# Patient Record
Sex: Female | Born: 1995 | Hispanic: Yes | Marital: Married | Smoking: Never smoker
Health system: Southern US, Community
[De-identification: ages and names within clinical notes are randomized; demographics above are authoritative.]

## PROBLEM LIST (undated history)

## (undated) DIAGNOSIS — F329 Major depressive disorder, single episode, unspecified: Secondary | ICD-10-CM

## (undated) DIAGNOSIS — K589 Irritable bowel syndrome without diarrhea: Secondary | ICD-10-CM

## (undated) DIAGNOSIS — R1031 Right lower quadrant pain: Secondary | ICD-10-CM

## (undated) DIAGNOSIS — F319 Bipolar disorder, unspecified: Secondary | ICD-10-CM

## (undated) DIAGNOSIS — F419 Anxiety disorder, unspecified: Secondary | ICD-10-CM

## (undated) DIAGNOSIS — F32A Depression, unspecified: Secondary | ICD-10-CM

## (undated) HISTORY — PX: LASIK: SHX215

## (undated) HISTORY — PX: APPENDECTOMY: SHX54

## (undated) HISTORY — PX: NO PAST SURGERIES: SHX2092

## (undated) HISTORY — DX: Irritable bowel syndrome, unspecified: K58.9

## (undated) HISTORY — PX: COLONOSCOPY: SHX174

---
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
## 2013-03-02 ENCOUNTER — Other Ambulatory Visit: Payer: Self-pay | Admitting: Gastroenterology

## 2013-03-02 DIAGNOSIS — R109 Unspecified abdominal pain: Secondary | ICD-10-CM

## 2013-03-13 ENCOUNTER — Ambulatory Visit
Admission: RE | Admit: 2013-03-13 | Discharge: 2013-03-13 | Disposition: A | Discharge status: Home / Self Care | Payer: BC Managed Care – PPO | Source: Ambulatory Visit | Attending: Gastroenterology | Admitting: Gastroenterology

## 2013-03-13 DIAGNOSIS — R109 Unspecified abdominal pain: Secondary | ICD-10-CM

## 2013-07-13 ENCOUNTER — Emergency Department (HOSPITAL_COMMUNITY)
Admission: EM | Admit: 2013-07-13 | Discharge: 2013-07-14 | Disposition: A | Discharge status: Home / Self Care | Payer: BC Managed Care – PPO | Attending: Emergency Medicine | Admitting: Emergency Medicine

## 2013-07-13 ENCOUNTER — Emergency Department (HOSPITAL_COMMUNITY): Payer: BC Managed Care – PPO

## 2013-07-13 ENCOUNTER — Encounter (HOSPITAL_COMMUNITY): Payer: Self-pay | Admitting: Emergency Medicine

## 2013-07-13 DIAGNOSIS — Z3202 Encounter for pregnancy test, result negative: Secondary | ICD-10-CM | POA: Insufficient documentation

## 2013-07-13 DIAGNOSIS — R1012 Left upper quadrant pain: Secondary | ICD-10-CM | POA: Insufficient documentation

## 2013-07-13 DIAGNOSIS — R161 Splenomegaly, not elsewhere classified: Secondary | ICD-10-CM | POA: Insufficient documentation

## 2013-07-13 DIAGNOSIS — N289 Disorder of kidney and ureter, unspecified: Secondary | ICD-10-CM | POA: Insufficient documentation

## 2013-07-13 DIAGNOSIS — F3130 Bipolar disorder, current episode depressed, mild or moderate severity, unspecified: Secondary | ICD-10-CM | POA: Insufficient documentation

## 2013-07-13 DIAGNOSIS — B2790 Infectious mononucleosis, unspecified without complication: Secondary | ICD-10-CM | POA: Insufficient documentation

## 2013-07-13 DIAGNOSIS — Z79899 Other long term (current) drug therapy: Secondary | ICD-10-CM | POA: Insufficient documentation

## 2013-07-13 DIAGNOSIS — R109 Unspecified abdominal pain: Secondary | ICD-10-CM

## 2013-07-13 LAB — CBC WITH DIFFERENTIAL/PLATELET
Basophils Absolute: 0.2 10*3/uL — ABNORMAL HIGH (ref 0.0–0.1)
Basophils Relative: 2 % — ABNORMAL HIGH (ref 0–1)
Eosinophils Absolute: 0 10*3/uL (ref 0.0–0.7)
Eosinophils Relative: 0 % (ref 0–5)
HCT: 39.1 % (ref 36.0–46.0)
HEMOGLOBIN: 13.4 g/dL (ref 12.0–15.0)
LYMPHS ABS: 5.6 10*3/uL — AB (ref 0.7–4.0)
Lymphocytes Relative: 65 % — ABNORMAL HIGH (ref 12–46)
MCH: 31.1 pg (ref 26.0–34.0)
MCHC: 34.3 g/dL (ref 30.0–36.0)
MCV: 90.7 fL (ref 78.0–100.0)
MONOS PCT: 10 % (ref 3–12)
Monocytes Absolute: 0.9 10*3/uL (ref 0.1–1.0)
NEUTROS ABS: 2 10*3/uL (ref 1.7–7.7)
NEUTROS PCT: 23 % — AB (ref 43–77)
Platelets: 171 10*3/uL (ref 150–400)
RBC: 4.31 MIL/uL (ref 3.87–5.11)
RDW: 13.7 % (ref 11.5–15.5)
WBC: 8.6 10*3/uL (ref 4.0–10.5)

## 2013-07-13 LAB — POCT PREGNANCY, URINE: Preg Test, Ur: NEGATIVE

## 2013-07-13 MED ORDER — OXYCODONE-ACETAMINOPHEN 5-325 MG PO TABS
2.0000 | ORAL_TABLET | Freq: Once | ORAL | Status: AC
  Administered 2013-07-13: 2 via ORAL
  Filled 2013-07-13: qty 2

## 2013-07-13 NOTE — ED Provider Notes (Signed)
CSN: [id number]     Arrival date & time 07/13/13  2137 History  This chart was scribed for non-physician practitioner Antony Madura, PA-C working with Hurman Horn, MD by Donne Anon, ED Scribe. This patient was seen in room TR05C/TR05C and the patient's care was started at 2234.    First MD Initiated Contact with Patient 07/13/13 2234     Chief Complaint  Patient presents with  . Rib Injury    The history is provided by the patient. No language interpreter was used.   HPI Comments: Joanne Herrera is a 18 y.o. female who presents to the Emergency Department complaining of upper left sided abdominal pain pain described as throbbing that began this morning and acutely worsened this afternoon. She was diagnosed with mononucleosis yesterday at an Urgent Care. She reports associated pain with deep breaths. Nothing makes the pain better. She has tried Tylenol with little relief. She denies vomiting, fever, or any other symptoms. She denies any recent trauma or injury to the area.  Past Medical History  Diagnosis Date  . Bipolar depression   . Abdominal pain, acute, right lower quadrant 02/11/2012     Possible Appendix perforation, contained perforation, typhlitis, or Crohn's dz.    . Acute renal insufficiency 04/08/2012   Past Surgical History  Procedure Laterality Date  . No past surgeries    . Colonoscopy    . Laparoscopy  06/02/2012    Procedure: LAPAROSCOPY DIAGNOSTIC;  Surgeon: Cherylynn Ridges, MD;  Location: [facility] OR;  Service: General;  Laterality: N/A;  . Laparotomy  06/02/2012    Procedure: EXPLORATORY LAPAROTOMY;  Surgeon: Cherylynn Ridges, MD;  Location: [facility] OR;  Service: General;  Laterality: N/A;  . Partial colectomy  06/02/2012    Procedure: PARTIAL COLECTOMY;  Surgeon: Cherylynn Ridges, MD;  Location: [facility] OR;  Service: General;  Laterality: Right;   No family history on file. History  Substance Use Topics  . Smoking status: Never Smoker   . Smokeless tobacco: Never Used  . Alcohol Use:  No   OB History   Grav Para Term Preterm Abortions TAB SAB Ect Mult Living                 Review of Systems  Constitutional: Negative for fever.  Gastrointestinal: Positive for abdominal pain. Negative for vomiting.  All other systems reviewed and are negative.    Allergies  Review of patient's allergies indicates no known allergies.  Home Medications   Current Outpatient Rx  Name  Route  Sig  Dispense  Refill  . acetaminophen (TYLENOL) 325 MG tablet   Oral   Take 2 tablets (650 mg total) by mouth every 6 (six) hours as needed (or Temp > 100).         Marland Kitchen divalproex (DEPAKOTE ER) 500 MG 24 hr tablet   Oral   Take 1,000 mg by mouth at bedtime.         Marland Kitchen GILDESS FE 1/20 1-20 MG-MCG tablet   Oral   Take 1 tablet by mouth daily.          Marland Kitchen lamoTRIgine (LAMICTAL) 100 MG tablet   Oral   Take 100 mg by mouth at bedtime.          BP 121/81  Pulse 89  Temp(Src) 98 F (36.7 C) (Oral)  Resp 16  Ht 5\' 3"  (1.6 m)  Wt 139 lb 6 oz (63.22 kg)  BMI 24.70 kg/m2  SpO2 96%  LMP 07/06/2013  Physical Exam  Nursing note and vitals reviewed. Constitutional: She is oriented to person, place, and time. She appears well-developed and well-nourished. No distress.  HENT:  Head: Normocephalic and atraumatic.  Mouth/Throat: Oropharynx is clear and moist. No oropharyngeal exudate.  Mildly erythematous posterior oropharynx with swollen tonsils bilaterally. No tonsillar exudate. Uvula midline without evidence of peritonsillar abscess. Patient tolerating secretions without difficulty or drooling.  Eyes: Conjunctivae and EOM are normal. Pupils are equal, round, and reactive to light. No scleral icterus.  Neck: Normal range of motion.  Cardiovascular: Normal rate, regular rhythm, normal heart sounds and intact distal pulses.   Pulmonary/Chest: Effort normal and breath sounds normal. No respiratory distress. She has no wheezes. She has no rales.  Abdominal: Soft. She exhibits no  distension. There is tenderness (focal in left upper quadrant). There is no rebound and no guarding.  No appreciable splenomegaly. No peritoneal signs or evidence of acute surgical abdomen.  Musculoskeletal: Normal range of motion.  Neurological: She is alert and oriented to person, place, and time.  Skin: Skin is warm and dry. No rash noted. She is not diaphoretic. No erythema. No pallor.  Psychiatric: She has a normal mood and affect. Her behavior is normal.    ED Course  Procedures (including critical care time) DIAGNOSTIC STUDIES: Oxygen Saturation is 97% on RA, adequate by my interpretation.    COORDINATION OF CARE: 11:06 PM Discussed treatment plan which includes Percocet, abdominal ultrasound, urinalysis and labs with pt at bedside and pt agreed to plan. Advised pt to get lots of rest, avoid contact sports, and stay well hydrated.  Labs Review Labs Reviewed  CBC WITH DIFFERENTIAL - Abnormal; Notable for the following:    Neutrophils Relative % 23 (*)    Lymphocytes Relative 65 (*)    Lymphs Abs 5.6 (*)    Basophils Relative 2 (*)    Basophils Absolute 0.2 (*)    All other components within normal limits  URINALYSIS, ROUTINE W REFLEX MICROSCOPIC  COMPREHENSIVE METABOLIC PANEL  POCT PREGNANCY, URINE   Imaging Review Dg Ribs Unilateral W/chest Left  07/13/2013   CLINICAL DATA:  Mono and left-sided pain.  EXAM: LEFT RIBS AND CHEST - 3+ VIEW  COMPARISON:  DG CHEST 2 VIEW dated 04/12/2006  FINDINGS: AP view of the chest and 3 views of left-sided ribs.  Midline trachea. Normal heart size and mediastinal contours. No pleural effusion or pneumothorax. Mildly low lung volumes. Minimal volume loss at the lung bases without airspace consolidation.  Three views of left-sided ribs. No rib fracture. No focal osseous lesion. Surgical clips in the upper abdomen.  IMPRESSION: No acute findings.   Electronically Signed   By: Jeronimo Greaves M.D.   On: 07/13/2013 23:31   US Abdomen  Complete  07/14/2013   CLINICAL DATA:  Left upper quadrant abdominal pain. Known mononucleosis. Assess for splenomegaly.  EXAM: ULTRASOUND ABDOMEN COMPLETE  COMPARISON:  None.  FINDINGS: Gallbladder:  No gallstones or wall thickening visualized. No sonographic Murphy sign noted.  Common bile duct:  Diameter: 0.5 cm, within normal limits in caliber.  Liver:  No focal lesion identified. Within normal limits in parenchymal echogenicity.  IVC:  No abnormality visualized.  Pancreas:  Visualized portion unremarkable.  Spleen:  The spleen remains normal in length, measuring 11.6 cm. However, the volume is greater than the normal range, measuring 402.5 mL. A small splenule is seen at the splenic hilum.  Right Kidney:  Length: 10.8 cm. Echogenicity within normal limits. No mass or hydronephrosis  visualized.  Left Kidney:  Length: 10.5 cm. Echogenicity within normal limits. No mass or hydronephrosis visualized.  Abdominal aorta:  No aneurysm visualized.  Other findings:  None.  IMPRESSION: 1. Mild splenomegaly noted; the spleen has a volume of 402.5 mL. 2. Otherwise unremarkable abdominal ultrasound.   Electronically Signed   By: Roanna RaiderJeffery  Chang M.D.   On: 07/14/2013 00:50    EKG Interpretation   None       MDM   1. Abdominal pain   2. Splenomegaly   3. Mononucleosis    Uncomplicated abdominal pain, likely secondary to mild splenomegaly associated with mononucleosis. Patient with positive Monospot at urgent care 2 days ago. No peritoneal signs appreciated. Patient without leukocytosis or anemia. Urinalysis does not suggest infection and urine pregnancy negative. Patient is well and nontoxic appearing, hemodynamically stable, and afebrile.   CMP is pending at this time. Anticipate discharge with instruction for supportive treatments for mono. Patient has been instructed to refrain from contact sports or high-risk activities which could cause injury to her abdomen until she is cleared by her primary doctor.  Patient signed out to Earley FavorGail Schulz, FNP at end of shift who will follow up on CMP results and dispo appropriately.   I personally performed the services described in this documentation, which was scribed in my presence. The recorded information has been reviewed and is accurate.  Filed Vitals:   07/13/13 2151 07/14/13 0057  BP: 142/79 121/81  Pulse: 100 89  Temp: 98 F (36.7 C)   TempSrc: Oral   Resp: 16 16  Height: 5\' 3"  (1.6 m)   Weight: 139 lb 6 oz (63.22 kg)   SpO2: 97% 96%      Antony MaduraKelly Hamlin Devine, PA-C 07/14/13 0106

## 2013-07-13 NOTE — ED Notes (Signed)
Pt. reports left anterior ribcage pain onset last Friday after lifting heavy trays at work , pt. stated pain worse with movement , certain positions and deep inspiration . Pt. also stated that she was diagnosed with mononucleosis yesterday at an urgent care .

## 2013-07-14 ENCOUNTER — Emergency Department (HOSPITAL_COMMUNITY): Payer: BC Managed Care – PPO

## 2013-07-14 LAB — URINALYSIS, ROUTINE W REFLEX MICROSCOPIC
Bilirubin Urine: NEGATIVE
Glucose, UA: NEGATIVE mg/dL
Hgb urine dipstick: NEGATIVE
KETONES UR: NEGATIVE mg/dL
LEUKOCYTES UA: NEGATIVE
NITRITE: NEGATIVE
PROTEIN: NEGATIVE mg/dL
Specific Gravity, Urine: 1.013 (ref 1.005–1.030)
Urobilinogen, UA: 1 mg/dL (ref 0.0–1.0)
pH: 6.5 (ref 5.0–8.0)

## 2013-07-14 LAB — COMPREHENSIVE METABOLIC PANEL
ALK PHOS: 216 U/L — AB (ref 39–117)
ALT: 267 U/L — ABNORMAL HIGH (ref 0–35)
AST: 218 U/L — ABNORMAL HIGH (ref 0–37)
Albumin: 3.8 g/dL (ref 3.5–5.2)
BILIRUBIN TOTAL: 0.5 mg/dL (ref 0.3–1.2)
BUN: 8 mg/dL (ref 6–23)
CHLORIDE: 105 meq/L (ref 96–112)
CO2: 24 meq/L (ref 19–32)
Calcium: 9.6 mg/dL (ref 8.4–10.5)
Creatinine, Ser: 0.68 mg/dL (ref 0.50–1.10)
GFR calc non Af Amer: >90 mL/min (ref >90)
GLUCOSE: 97 mg/dL (ref 70–99)
POTASSIUM: 4.4 meq/L (ref 3.7–5.3)
Sodium: 142 mEq/L (ref 137–147)
Total Protein: 8.3 g/dL (ref 6.0–8.3)

## 2013-07-14 NOTE — Discharge Instructions (Signed)
Be sure that you get plenty of rest and drink plenty of fluids. Refrain from contact sports or high-risk activities that could cause trauma to your abdomen until cleared by your primary doctor. Return to the emergency department if symptoms worsen.  Infectious Mononucleosis Infectious mononucleosis (mono) is a common germ (viral) infection in children, teenagers, and young adults.  CAUSES  Mono is an infection caused by the Malachi CarlEpstein Barr virus. The virus is spread by close personal contact with someone who has the infection. It can be passed by contact with your saliva through things such as kissing or sharing drinking glasses. Sometimes, the infection can be spread from someone who does not appear sick but still spreads the virus (asymptomatic carrier state).  SYMPTOMS  The most common symptoms of Mono are:  Sore throat.  Headache.  Fatigue.  Muscle aches.  Swollen glands.  Fever.  Poor appetite.  Enlarged liver or spleen. The less common symptoms can include:  Rash.  Feeling sick to your stomach (nauseous).  Abdominal pain. DIAGNOSIS  Mono is diagnosed by a blood test.  TREATMENT  Treatment of mono is usually at home. There is no medicine that cures this virus. Sometimes hospital treatment is needed in severe cases. Steroid medicine sometimes is needed if the swelling in the throat causes breathing or swallowing problems.  HOME CARE INSTRUCTIONS   Drink enough fluids to keep your urine clear or pale yellow.  Eat soft foods. Cool foods like popsicles or ice cream can soothe a sore throat.  Only take over-the-counter or prescription medicines for pain, discomfort, or fever as directed by your caregiver. Children under 20 years of age should not take aspirin.  Gargle salt water. This may help relieve your sore throat. Put 1 teaspoon (tsp) of salt in 1 cup of warm water. Sucking on hard candy may also help.  Rest as needed.  Start regular activities gradually after the  fever is gone. Be sure to rest when tired.  Avoid strenuous exercise or contact sports until your caregiver says it is okay. The liver and spleen could be seriously injured.  Avoid sharing drinking glasses or kissing until your caregiver tells you that you are no longer contagious. SEEK MEDICAL CARE IF:   Your fever is not gone after 7 days.  Your activity level is not back to normal after 2 weeks.  You have yellow coloring to eyes and skin (jaundice). SEEK IMMEDIATE MEDICAL CARE IF:   You have severe pain in the abdomen or shoulder.  You have trouble swallowing or drooling.  You have trouble breathing.  You develop a stiff neck.  You develop a severe headache.  You cannot stop throwing up (vomiting).  You have convulsions.  You are confused.  You have trouble with balance.  You develop signs of body fluid loss (dehydration):  Weakness.  Sunken eyes.  Pale skin.  Dry mouth.  Rapid breathing or pulse. MAKE SURE YOU:   Understand these instructions.  Will watch your condition.  Will get help right away if you are not doing well or get worse. Document Released: 06/08/2000 Document Revised: 09/03/2011 Document Reviewed: 04/06/2008 [facility] Patient Information 2014 [employer].

## 2013-07-16 NOTE — ED Provider Notes (Signed)
Medical screening examination/treatment/procedure(s) were performed by non-physician practitioner and as supervising physician I was immediately available for consultation/collaboration.  Makyia Erxleben M Lolly Glaus, MD 07/16/13 1844 

## 2013-11-28 IMAGING — CT CT ABD-PELV W/ CM
2 of 4 series · 17 of 46 positions shown, 19 images · IV contrast (omnipaque)
Comparison: CT of 02/04/2012

CLINICAL DATA: Follow up of right lower quadrant inflammatory
process.

CT ABDOMEN AND PELVIS WITH CONTRAST
TECHNIQUE: Multidetector CT imaging of the abdomen and pelvis was
performed following the standard protocol during bolus
administration of intravenous contrast.
Contrast: 100mL OMNIPAQUE IOHEXOL 300 MG/ML  SOLN

[Series 2: abd/pelv with 5.0 b31f st · axial · 0.83mm/px · z∈[-493,-83]mm · 14 of 90 slices shown, 16 images]
[im 4/90  soft-tissue]
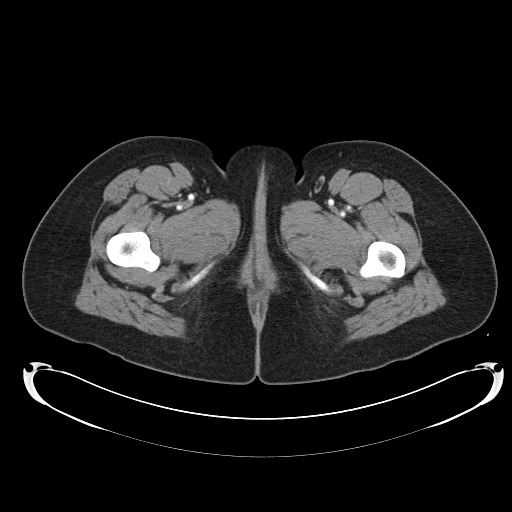
[im 4/90  bone]
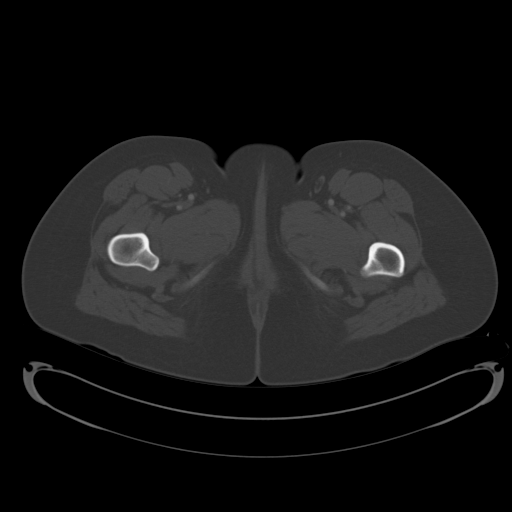
[im 12/90  soft-tissue]
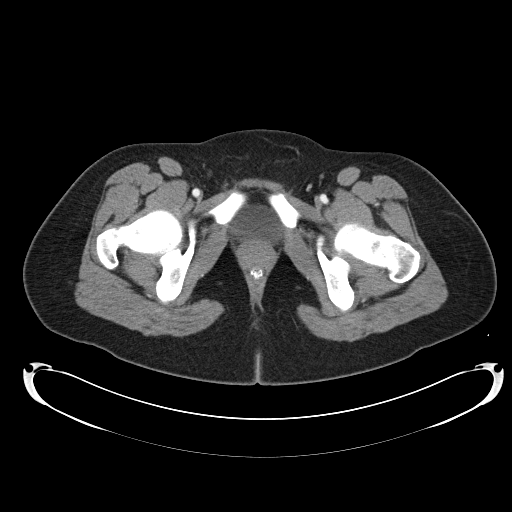
[im 19/90  soft-tissue]
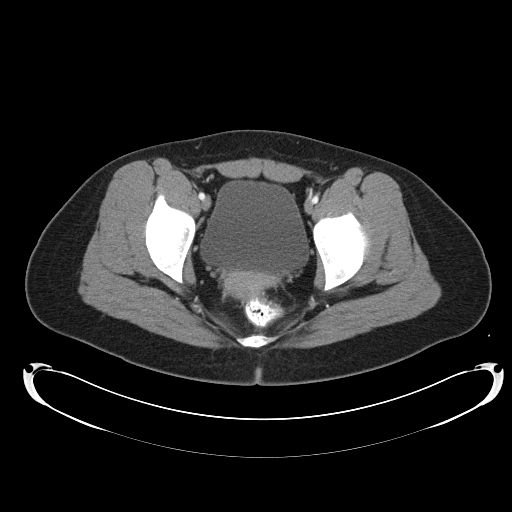
[im 23/90  soft-tissue]
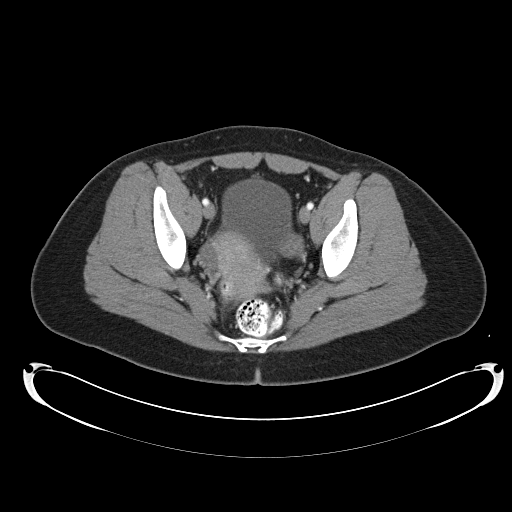
[im 30/90  soft-tissue]
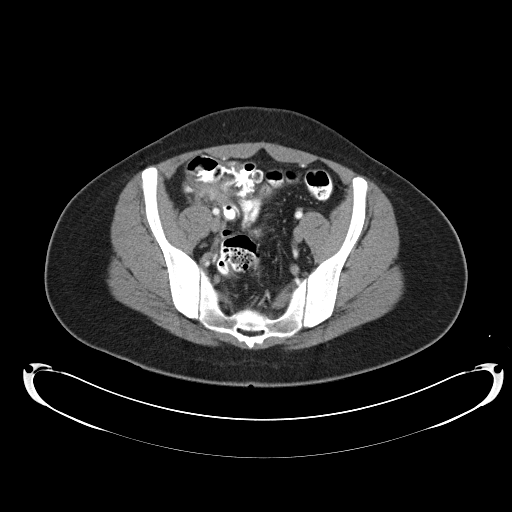
[im 38/90  soft-tissue]
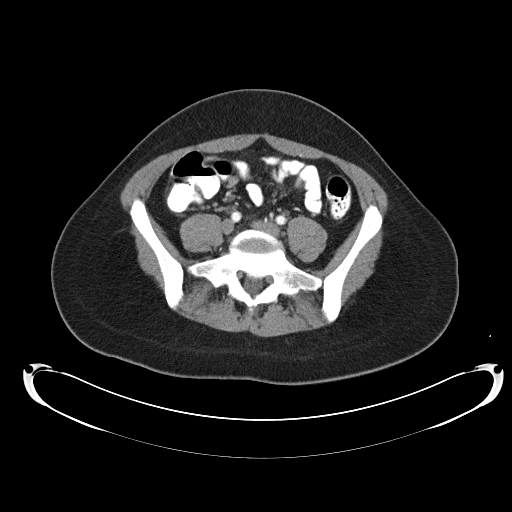
[im 41/90  soft-tissue]
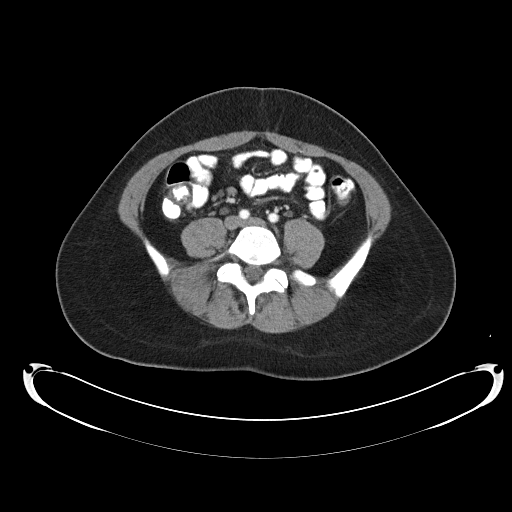
[im 49/90  soft-tissue]
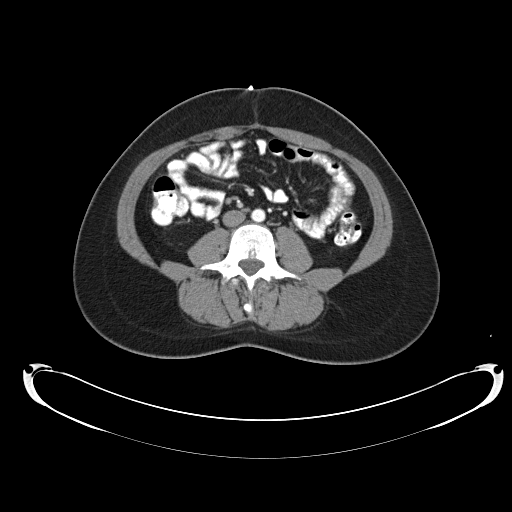
[im 52/90  soft-tissue]
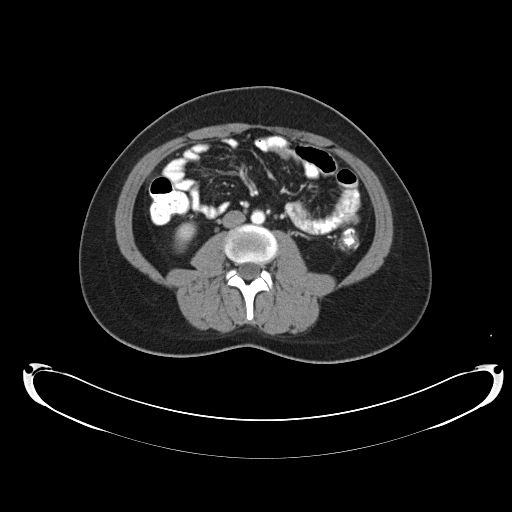
[im 52/90  bone]
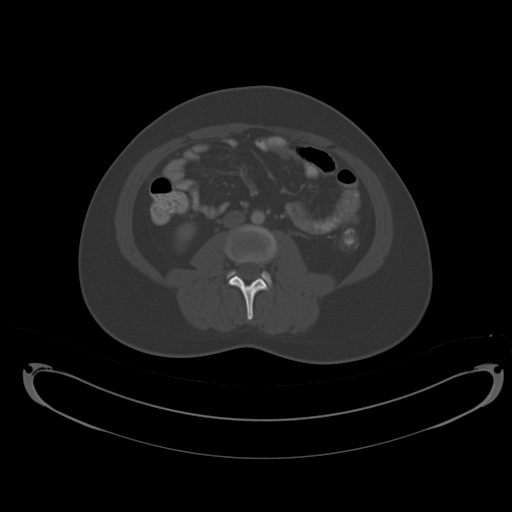
[im 60/90  soft-tissue]
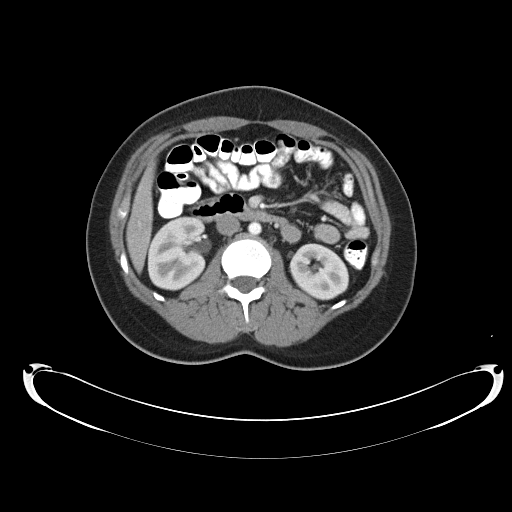
[im 67/90  soft-tissue]
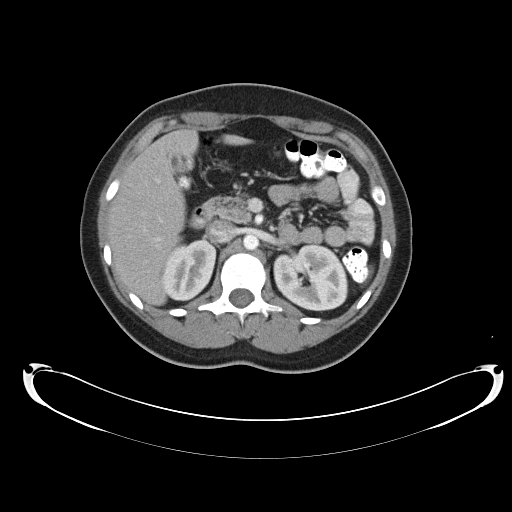
[im 71/90  soft-tissue]
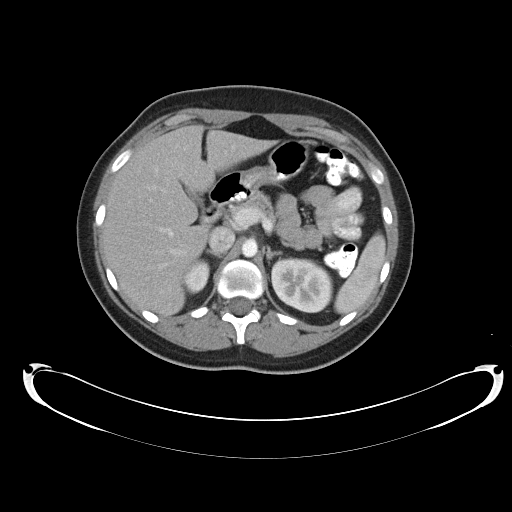
[im 78/90  soft-tissue]
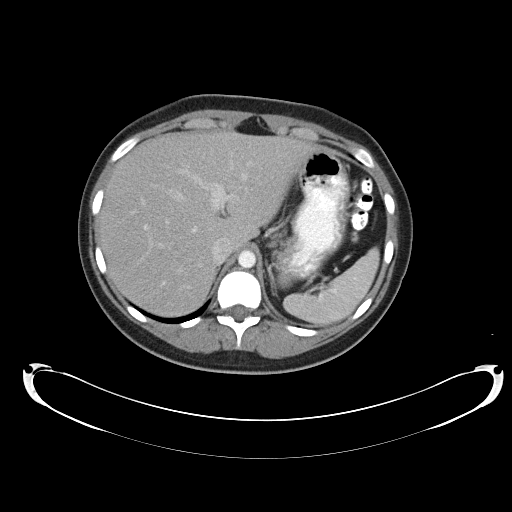
[im 86/90  soft-tissue]
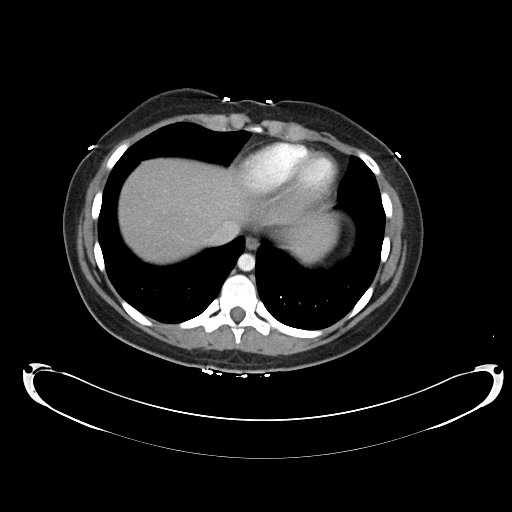

[Series 5: coronals · coronal · 0.94mm/px · 3 of 68 slices shown]
[im 23/68  soft-tissue]
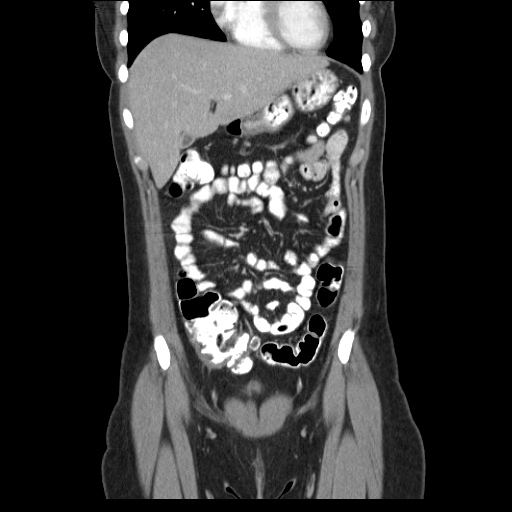
[im 30/68  soft-tissue]
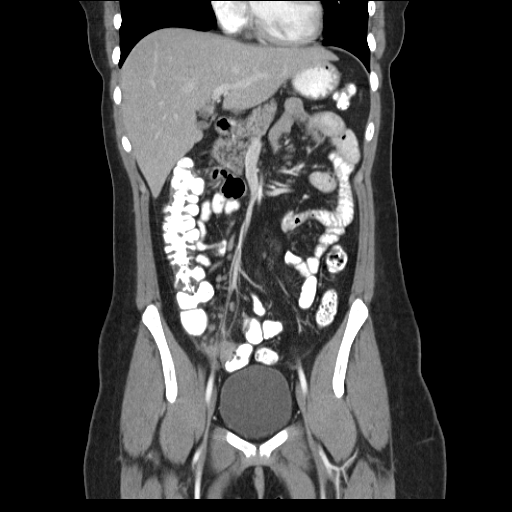
[im 38/68  soft-tissue]
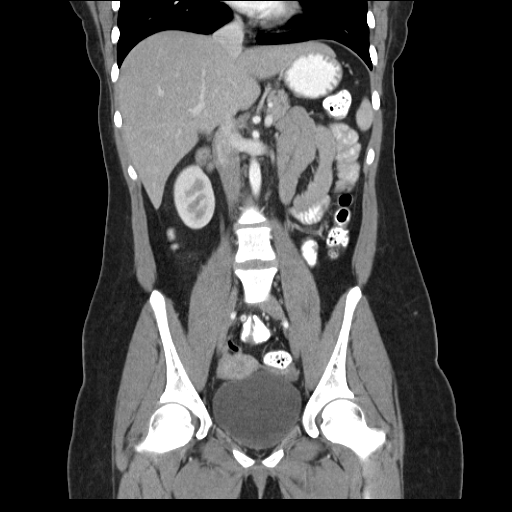

[17 of 46 positions shown; findings below may reference images not displayed]

FINDINGS: Clear lung bases.  Normal heart size without pericardial
or pleural effusion.  Normal liver, spleen, stomach, pancreas,
gallbladder, biliary tract, adrenal glands, kidneys. No
retroperitoneal or retrocrural adenopathy.

There is improvement in inflammation centered at the base of the
cecum.  Image 62 today.  There is no evidence of well-defined fluid
collection to suggest abscess.  The terminal ileum and ileocecal
valve are both positioned more medially and within normal limits.
The appendix this is normal, and originates just inferior and
medial to this inflammatory process.  Example images 58 and 56. The
previously described extraluminal contrast adjacent the cecal base
is no longer present.

The remainder of the colon is normal. The small bowel loops are
unremarkable.  There are mildly prominent ileocolic nodes which are
similar to improved and likely reactive.  No ascites or free
intraperitoneal air.  No pelvic sidewall adenopathy.  Normal
urinary bladder and uterus, without adnexal mass or significant
free pelvic fluid. No acute osseous abnormality.
IMPRESSION: Improvement in inflammatory process along the lateral wall of the
base of the cecum.  Favored to represent nearly completely resolved
cecal diverticulitis.  Alternate explanations, including an unusual
appearance of inflammatory bowel disease or resolving colitis are
felt less likely.

## 2014-04-19 ENCOUNTER — Other Ambulatory Visit: Payer: Self-pay | Admitting: Gastroenterology

## 2014-04-19 DIAGNOSIS — R1031 Right lower quadrant pain: Secondary | ICD-10-CM

## 2014-04-23 ENCOUNTER — Other Ambulatory Visit: Payer: BC Managed Care – PPO

## 2015-02-16 ENCOUNTER — Other Ambulatory Visit: Payer: Self-pay

## 2015-02-17 LAB — CYTOLOGY - PAP

## 2016-06-28 ENCOUNTER — Emergency Department (HOSPITAL_COMMUNITY)
Admission: EM | Admit: 2016-06-28 | Discharge: 2016-06-28 | Disposition: A | Discharge status: Home / Self Care | Payer: No Typology Code available for payment source | Attending: Emergency Medicine | Admitting: Emergency Medicine

## 2016-06-28 ENCOUNTER — Emergency Department (HOSPITAL_COMMUNITY): Payer: No Typology Code available for payment source

## 2016-06-28 ENCOUNTER — Encounter (HOSPITAL_COMMUNITY): Payer: Self-pay

## 2016-06-28 DIAGNOSIS — S161XXA Strain of muscle, fascia and tendon at neck level, initial encounter: Secondary | ICD-10-CM

## 2016-06-28 DIAGNOSIS — Y999 Unspecified external cause status: Secondary | ICD-10-CM | POA: Diagnosis not present

## 2016-06-28 DIAGNOSIS — Y939 Activity, unspecified: Secondary | ICD-10-CM | POA: Diagnosis not present

## 2016-06-28 DIAGNOSIS — Y92410 Unspecified street and highway as the place of occurrence of the external cause: Secondary | ICD-10-CM | POA: Diagnosis not present

## 2016-06-28 DIAGNOSIS — S199XXA Unspecified injury of neck, initial encounter: Secondary | ICD-10-CM | POA: Diagnosis present

## 2016-06-28 LAB — POC URINE PREG, ED: Preg Test, Ur: NEGATIVE

## 2016-06-28 MED ORDER — ACETAMINOPHEN 500 MG PO TABS
1000.0000 mg | ORAL_TABLET | Freq: Once | ORAL | Status: AC
  Administered 2016-06-28: 1000 mg via ORAL
  Filled 2016-06-28: qty 2
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
## 2016-06-28 NOTE — ED Notes (Signed)
Pt. returned from XR. 

## 2016-06-28 NOTE — ED Notes (Signed)
Patient waiting on discharge information

## 2016-06-28 NOTE — ED Notes (Addendum)
Pt transported to XR.  

## 2016-08-13 ENCOUNTER — Other Ambulatory Visit: Payer: Self-pay

## 2016-08-13 ENCOUNTER — Encounter (HOSPITAL_COMMUNITY): Payer: Self-pay

## 2016-08-13 ENCOUNTER — Emergency Department (HOSPITAL_COMMUNITY): Payer: BLUE CROSS/BLUE SHIELD

## 2016-08-13 ENCOUNTER — Emergency Department (HOSPITAL_COMMUNITY)
Admission: EM | Admit: 2016-08-13 | Discharge: 2016-08-13 | Disposition: A | Discharge status: Home / Self Care | Payer: BLUE CROSS/BLUE SHIELD | Attending: Emergency Medicine | Admitting: Emergency Medicine

## 2016-08-13 DIAGNOSIS — R079 Chest pain, unspecified: Secondary | ICD-10-CM

## 2016-08-13 DIAGNOSIS — R1011 Right upper quadrant pain: Secondary | ICD-10-CM | POA: Insufficient documentation

## 2016-08-13 DIAGNOSIS — R109 Unspecified abdominal pain: Secondary | ICD-10-CM

## 2016-08-13 LAB — URINALYSIS, ROUTINE W REFLEX MICROSCOPIC
Bilirubin Urine: NEGATIVE
Glucose, UA: NEGATIVE mg/dL
Hgb urine dipstick: NEGATIVE
Ketones, ur: NEGATIVE mg/dL
Leukocytes, UA: NEGATIVE
NITRITE: NEGATIVE
PH: 7 (ref 5.0–8.0)
Protein, ur: NEGATIVE mg/dL
SPECIFIC GRAVITY, URINE: 1.018 (ref 1.005–1.030)

## 2016-08-13 LAB — COMPREHENSIVE METABOLIC PANEL
ALBUMIN: 4 g/dL (ref 3.5–5.0)
ALK PHOS: 61 U/L (ref 38–126)
ALT: 107 U/L — ABNORMAL HIGH (ref 14–54)
ANION GAP: 11 (ref 5–15)
AST: 64 U/L — ABNORMAL HIGH (ref 15–41)
BUN: 8 mg/dL (ref 6–20)
CALCIUM: 9.3 mg/dL (ref 8.9–10.3)
CHLORIDE: 101 mmol/L (ref 101–111)
CO2: 26 mmol/L (ref 22–32)
Creatinine, Ser: 0.66 mg/dL (ref 0.44–1.00)
GFR calc non Af Amer: >60 mL/min (ref >60)
Glucose, Bld: 85 mg/dL (ref 65–99)
POTASSIUM: 3.7 mmol/L (ref 3.5–5.1)
SODIUM: 138 mmol/L (ref 135–145)
Total Bilirubin: 0.9 mg/dL (ref 0.3–1.2)
Total Protein: 7.1 g/dL (ref 6.5–8.1)

## 2016-08-13 LAB — I-STAT BETA HCG BLOOD, ED (MC, WL, AP ONLY)

## 2016-08-13 LAB — LIPASE, BLOOD: LIPASE: 41 U/L (ref 11–51)

## 2016-08-13 LAB — CBC
HEMATOCRIT: 43.8 % (ref 36.0–46.0)
HEMOGLOBIN: 15.2 g/dL — AB (ref 12.0–15.0)
MCH: 30.6 pg (ref 26.0–34.0)
MCHC: 34.7 g/dL (ref 30.0–36.0)
MCV: 88.3 fL (ref 78.0–100.0)
Platelets: 325 10*3/uL (ref 150–400)
RBC: 4.96 MIL/uL (ref 3.87–5.11)
RDW: 12.2 % (ref 11.5–15.5)
WBC: 15.7 10*3/uL — ABNORMAL HIGH (ref 4.0–10.5)

## 2016-08-13 LAB — D-DIMER, QUANTITATIVE: D-Dimer, Quant: 0.49 ug/mL-FEU (ref 0.00–0.50)

## 2016-08-13 MED ORDER — SODIUM CHLORIDE 0.9 % IV BOLUS (SEPSIS)
1000.0000 mL | Freq: Once | INTRAVENOUS | Status: AC
  Administered 2016-08-13: 1000 mL via INTRAVENOUS

## 2016-08-13 MED ORDER — OMEPRAZOLE 20 MG PO CPDR: 20.0000 mg | DELAYED_RELEASE_CAPSULE | Freq: Every day | ORAL | 0 refills | Status: DC

## 2016-08-13 MED ORDER — ONDANSETRON HCL 4 MG/2ML IJ SOLN
4.0000 mg | Freq: Once | INTRAMUSCULAR | Status: AC
  Administered 2016-08-13: 4 mg via INTRAVENOUS
  Filled 2016-08-13: qty 2

## 2016-08-13 NOTE — ED Notes (Signed)
ED Provider at bedside. 

## 2016-08-13 NOTE — ED Provider Notes (Signed)
[facility]-EMERGENCY DEPT Provider Note   CSN: [id number] Arrival date & time: 08/13/16  1136   By signing my name below, I, Avnee Patel, attest that this documentation has been prepared under the direction and in the presence of Benjiman CoreNathan Alaynna Kerwood, MD  Electronically Signed: Clovis PuAvnee Patel, ED Scribe. 08/13/16. 1:03 PM.   History   Chief Complaint Chief Complaint  Patient presents with  . right sided chest pain   The history is provided by the patient. No language interpreter was used.   HPI Comments:  Joanne Herrera is a 21 y.o. female, with a PSHx of a partial colectomy, who presents to the Emergency Department complaining of persistent right sided abdominal pain onset 3 weeks. Pt also reports right sided chest pain located under her right breast onset 3 days which is worse with deep breathing, nausea and 4-6 episodes of daily diarrhea. Pt reports her most recent CT-scan was 6 days ago. She was seen at Urgent Care and was given medication for her nausea which has provided relief. She also reports she has been taking antibiotics which have provided no relief. Pt denies cough, SOB or any other associated symptoms. Pt is followed by Dr. Madilyn FiremanHayes.   Past Medical History:  Diagnosis Date  . Abdominal pain, acute, right lower quadrant 02/11/2012    Possible Appendix perforation, contained perforation, typhlitis, or Crohn's dz.    . Acute renal insufficiency 04/08/2012  . Bipolar depression [facility](HCC)     Patient Active Problem List   Diagnosis Date Noted  . Postop check 06/24/2012  . Chronic appendicitis 04/15/2012  . Acute renal insufficiency 04/08/2012  . Typhlitis 02/26/2012  . Abdominal pain, acute, right lower quadrant 02/11/2012  . Bipolar depression (HCC)     Past Surgical History:  Procedure Laterality Date  . COLONOSCOPY    . LAPAROSCOPY  06/02/2012   Procedure: LAPAROSCOPY DIAGNOSTIC;  Surgeon: Cherylynn RidgesJames O Wyatt, MD;  [facility];  Service: General;  Laterality: N/A;  . LAPAROTOMY   06/02/2012   Procedure: EXPLORATORY LAPAROTOMY;  Surgeon: Cherylynn RidgesJames O Wyatt, MD;  Location: [facility] OR;  Service: General;  Laterality: N/A;  . NO PAST SURGERIES    . PARTIAL COLECTOMY  06/02/2012   Procedure: PARTIAL COLECTOMY;  Surgeon: Cherylynn RidgesJames O Wyatt, MD;  Location: [facility] OR;  Service: General;  Laterality: Right;    OB History    No data available       Home Medications    Prior to Admission medications   Medication Sig Start Date End Date Taking? Authorizing Provider  acetaminophen (TYLENOL) 325 MG tablet Take 2 tablets (650 mg total) by mouth every 6 (six) hours as needed (or Temp > 100). 04/08/12   Sherrie GeorgeWillard Jennings, PA-C  divalproex (DEPAKOTE ER) 500 MG 24 hr tablet Take 1,000 mg by mouth at bedtime.    Historical Provider, MD  GILDESS FE 1/20 1-20 MG-MCG tablet Take 1 tablet by mouth daily.  05/13/12   Historical Provider, MD  lamoTRIgine (LAMICTAL) 100 MG tablet Take 100 mg by mouth at bedtime.    Historical Provider, MD    Family History No family history on file.  Social History Social History  Substance Use Topics  . Smoking status: Never Smoker  . Smokeless tobacco: Never Used  . Alcohol use No     Allergies   Patient has no known allergies.   Review of Systems Review of Systems  Respiratory: Negative for cough and shortness of breath.   Gastrointestinal: Positive for abdominal pain, diarrhea and nausea.  Musculoskeletal: Positive for myalgias.   Physical Exam Updated Vital Signs BP 154/94   Pulse 118   Temp 99.1 F (37.3 C)   Resp 18   Ht 5\' 3"  (1.6 m)   Wt 150 lb (68 kg)   LMP 07/17/2016   SpO2 97%   BMI 26.57 kg/m   Physical Exam  Constitutional: She is oriented to person, place, and time. She appears well-developed and well-nourished. No distress.  HENT:  Head: Normocephalic and atraumatic.  Eyes: Conjunctivae are normal.  Cardiovascular: Normal rate and regular rhythm.   Pulmonary/Chest: Effort normal and breath sounds normal. She has no wheezes.  She has no rhonchi. She has no rales.  Abdominal: She exhibits no distension. There is tenderness (mild) in the right upper quadrant and epigastric area. There is no rebound and no guarding.  Neurological: She is alert and oriented to person, place, and time.  Skin: Skin is warm and dry. No rash noted.  Psychiatric: She has a normal mood and affect.  Nursing note and vitals reviewed.  ED Treatments / Results  COORDINATION OF CARE:  1:00 PM Discussed treatment plan with pt at bedside and pt agreed to plan.  Labs (all labs ordered are listed, but only abnormal results are displayed) Labs Reviewed  COMPREHENSIVE METABOLIC PANEL - Abnormal; Notable for the following:       Result Value   AST 64 (*)    ALT 107 (*)    All other components within normal limits  CBC - Abnormal; Notable for the following:    WBC 15.7 (*)    Hemoglobin 15.2 (*)    All other components within normal limits  URINALYSIS, ROUTINE W REFLEX MICROSCOPIC - Abnormal; Notable for the following:    APPearance CLOUDY (*)    All other components within normal limits  LIPASE, BLOOD  D-DIMER, QUANTITATIVE (NOT AT [facility])  I-STAT BETA HCG BLOOD, ED ([facility], [facility], [facility] ONLY)    EKG  EKG Interpretation  Date/Time:  Monday August 13 2016 11:41:23 EST Ventricular Rate:  102 PR Interval:  116 QRS Duration: 82 QT Interval:  336 QTC Calculation: 437 R Axis:   82 Text Interpretation:  Sinus tachycardia Otherwise normal ECG Confirmed by Rubin Payor  MD, Becka Lagasse [id number]) on 08/13/2016 12:47:35 PM       Radiology Dg Chest 2 View  Result Date: 08/13/2016 CLINICAL DATA:  Abdominal pain. EXAM: CHEST  2 VIEW COMPARISON:  07/13/2013 . FINDINGS: Mediastinum hilar structures are normal. No focal infiltrate. Heart size stable. No pleural effusion or pneumothorax. No acute bony abnormality . IMPRESSION: Stable chest.  No acute cardiopulmonary disease. Electronically Signed   By: Maisie Fus  Register   On: 08/13/2016 14:03     Procedures Procedures (including critical care time)  Medications Ordered in ED Medications  sodium chloride 0.9 % bolus 1,000 mL (0 mLs Intravenous Stopped 08/13/16 1513)  ondansetron (ZOFRAN) injection 4 mg (4 mg Intravenous Given 08/13/16 1349)  sodium chloride 0.9 % bolus 1,000 mL (0 mLs Intravenous Stopped 08/13/16 1513)     Initial Impression / Assessment and Plan / ED Course  I have reviewed the triage vital signs and the nursing notes.  Pertinent labs & imaging results that were available during my care of the patient were reviewed by me and considered in my medical decision making (see chart for details).     Patient presents with chest pain abdominal pain. Has had nausea vomiting diarrhea. Started on high Cosamin by her gastroenterologist recently. Had CT scan done  a week ago that was reassuring. Reviewed report of that through care everywhere. Mildly elevated white count. Overall benign abdominal exam. LFTs minimally elevated. Recent CT scan did not show gallstones. Feels better after treatment and heart rate is decreased. Will discharge home to follow-up with GI in 2 days as planned. Also with her chest pain d-dimer done and was negative. Chest x-ray reassuring also.  Final Clinical Impressions(s) / ED Diagnoses   Final diagnoses:  Chest pain, unspecified type  Abdominal pain, unspecified abdominal location    New Prescriptions New Prescriptions   No medications on file  I personally performed the services described in this documentation, which was scribed in my presence. The recorded information has been reviewed and is accurate.        Benjiman Core, MD 08/13/16 1540

## 2016-08-13 NOTE — ED Triage Notes (Signed)
Patient complains of ongoing abdominal discomfort with diarrhea for several days. Now complaining of right anterior chest pain that is worse when she lays down , NAD

## 2016-08-13 NOTE — ED Notes (Signed)
Pt transported to xr 

## 2016-08-13 NOTE — ED Notes (Signed)
Pt ambulated to restroom to collect urine sample.

## 2016-10-10 ENCOUNTER — Other Ambulatory Visit: Payer: Self-pay | Admitting: Gastroenterology

## 2016-10-10 DIAGNOSIS — R1030 Lower abdominal pain, unspecified: Secondary | ICD-10-CM

## 2016-10-12 ENCOUNTER — Ambulatory Visit
Admission: RE | Admit: 2016-10-12 | Discharge: 2016-10-12 | Disposition: A | Discharge status: Home / Self Care | Payer: BLUE CROSS/BLUE SHIELD | Source: Ambulatory Visit | Attending: Gastroenterology | Admitting: Gastroenterology

## 2016-10-12 DIAGNOSIS — R1030 Lower abdominal pain, unspecified: Secondary | ICD-10-CM

## 2018-05-21 ENCOUNTER — Emergency Department (HOSPITAL_COMMUNITY)
Admission: EM | Admit: 2018-05-21 | Discharge: 2018-05-21 | Discharge status: Absent without leave | Payer: BLUE CROSS/BLUE SHIELD

## 2018-09-17 LAB — OB RESULTS CONSOLE GBS: GBS: POSITIVE

## 2018-09-18 ENCOUNTER — Other Ambulatory Visit: Payer: Self-pay

## 2018-09-18 ENCOUNTER — Inpatient Hospital Stay (HOSPITAL_COMMUNITY)
Admission: AD | Admit: 2018-09-18 | Discharge: 2018-09-18 | Disposition: A | Discharge status: Home / Self Care | Payer: BLUE CROSS/BLUE SHIELD | Attending: Obstetrics and Gynecology | Admitting: Obstetrics and Gynecology

## 2018-09-18 ENCOUNTER — Encounter (HOSPITAL_COMMUNITY): Payer: Self-pay | Admitting: *Deleted

## 2018-09-18 DIAGNOSIS — O26893 Other specified pregnancy related conditions, third trimester: Secondary | ICD-10-CM | POA: Diagnosis not present

## 2018-09-18 DIAGNOSIS — Z3689 Encounter for other specified antenatal screening: Secondary | ICD-10-CM

## 2018-09-18 DIAGNOSIS — Z3A36 36 weeks gestation of pregnancy: Secondary | ICD-10-CM | POA: Diagnosis not present

## 2018-09-18 DIAGNOSIS — R102 Pelvic and perineal pain: Secondary | ICD-10-CM

## 2018-09-18 DIAGNOSIS — R030 Elevated blood-pressure reading, without diagnosis of hypertension: Secondary | ICD-10-CM | POA: Diagnosis not present

## 2018-09-18 DIAGNOSIS — O479 False labor, unspecified: Secondary | ICD-10-CM | POA: Diagnosis not present

## 2018-09-18 LAB — COMPREHENSIVE METABOLIC PANEL
ALT: 13 U/L (ref 0–44)
AST: 22 U/L (ref 15–41)
Albumin: 3 g/dL — ABNORMAL LOW (ref 3.5–5.0)
Alkaline Phosphatase: 105 U/L (ref 38–126)
Anion gap: 9 (ref 5–15)
BUN: 6 mg/dL (ref 6–20)
CALCIUM: 9.2 mg/dL (ref 8.9–10.3)
CHLORIDE: 105 mmol/L (ref 98–111)
CO2: 22 mmol/L (ref 22–32)
CREATININE: 0.6 mg/dL (ref 0.44–1.00)
GFR calc Af Amer: >60 mL/min (ref >60)
Glucose, Bld: 79 mg/dL (ref 70–99)
Potassium: 4.1 mmol/L (ref 3.5–5.1)
SODIUM: 136 mmol/L (ref 135–145)
Total Bilirubin: 1 mg/dL (ref 0.3–1.2)
Total Protein: 6.6 g/dL (ref 6.5–8.1)

## 2018-09-18 LAB — CBC WITH DIFFERENTIAL/PLATELET
ABS IMMATURE GRANULOCYTES: 0.11 10*3/uL — AB (ref 0.00–0.07)
BASOS PCT: 0 %
Basophils Absolute: 0 10*3/uL (ref 0.0–0.1)
Eosinophils Absolute: 0 10*3/uL (ref 0.0–0.5)
Eosinophils Relative: 0 %
HCT: 41.6 % (ref 36.0–46.0)
HEMOGLOBIN: 13.8 g/dL (ref 12.0–15.0)
IMMATURE GRANULOCYTES: 1 %
LYMPHS PCT: 21 %
Lymphs Abs: 2 10*3/uL (ref 0.7–4.0)
MCH: 29.1 pg (ref 26.0–34.0)
MCHC: 33.2 g/dL (ref 30.0–36.0)
MCV: 87.6 fL (ref 80.0–100.0)
Monocytes Absolute: 0.5 10*3/uL (ref 0.1–1.0)
Monocytes Relative: 5 %
NEUTROS ABS: 6.8 10*3/uL (ref 1.7–7.7)
NEUTROS PCT: 73 %
PLATELETS: 321 10*3/uL (ref 150–400)
RBC: 4.75 MIL/uL (ref 3.87–5.11)
RDW: 12.2 % (ref 11.5–15.5)
WBC: 9.5 10*3/uL (ref 4.0–10.5)
nRBC: 0 % (ref 0.0–0.2)

## 2018-09-18 LAB — URINALYSIS, ROUTINE W REFLEX MICROSCOPIC
Bilirubin Urine: NEGATIVE
Glucose, UA: NEGATIVE mg/dL
Hgb urine dipstick: NEGATIVE
Ketones, ur: NEGATIVE mg/dL
NITRITE: NEGATIVE
PROTEIN: NEGATIVE mg/dL
SPECIFIC GRAVITY, URINE: 1.011 (ref 1.005–1.030)
pH: 7 (ref 5.0–8.0)

## 2018-09-18 LAB — PROTEIN / CREATININE RATIO, URINE
Creatinine, Urine: 95.44 mg/dL
PROTEIN CREATININE RATIO: 0.08 mg/mg{creat} (ref 0.00–0.15)
Total Protein, Urine: 8 mg/dL

## 2018-09-18 MED ORDER — ACETAMINOPHEN 500 MG PO TABS
1000.0000 mg | ORAL_TABLET | Freq: Once | ORAL | Status: AC
  Administered 2018-09-18: 1000 mg via ORAL
  Filled 2018-09-18: qty 2

## 2018-09-18 MED ORDER — LORATADINE 10 MG PO TABS
10.0000 mg | ORAL_TABLET | Freq: Every day | ORAL | Status: DC
  Administered 2018-09-18: 10 mg via ORAL
  Filled 2018-09-18: qty 1

## 2018-09-18 NOTE — Discharge Instructions (Signed)

## 2018-09-18 NOTE — MAU Provider Note (Signed)
History     CSN: [id number]  Arrival date and time: 09/18/18 1118   First Provider Initiated Contact with Patient 09/18/18 1147     Chief Complaint  Patient presents with  . Labor Eval   HPI Joanne Herrera is a 23 y.o. G1P0 at [redacted]w[redacted]d who presents to MAU as a labor check. In course of labor evaluation she was determined to have new onset elevated blood pressure and a headache. She denies vaginal bleeding, leaking of fluid, decreased fetal movement, fever, falls, or recent illness.    Labor Patient reports new onset "tightness" across her entire abdomen beginning this morning at 1015am. She rates her pain as 5-7/10. Her pain does not radiate. She took 500mg  of Tylenol last night for general discomfort but has not taken anything for pain this morning.  Blood Pressure and Headache Patient denies history of elevated blood pressure. Her headache is a recurring problem which she associates with seasonal allergies and [place] are animals and hay and lots of time outside". She typically manages her headaches with Tylenol and Zyrtec.She has not taken those medications this morning. She denies visual disturbances, RUQ or upper abdominal pain, new onset swelling or weight gain, weakness, SOB or syncope.  OB History    Gravida  1   Para      Term      Preterm      AB      Living        SAB      TAB      Ectopic      Multiple      Live Births              Past Medical History:  Diagnosis Date  . Abdominal pain, acute, right lower quadrant 02/11/2012    Possible Appendix perforation, contained perforation, typhlitis, or Crohn's dz.    . Acute renal insufficiency 04/08/2012  . Bipolar depression (HCC)     Past Surgical History:  Procedure Laterality Date  . APPENDECTOMY    . COLONOSCOPY    . LAPAROSCOPY  06/02/2012   Procedure: LAPAROSCOPY DIAGNOSTIC;  Surgeon: Cherylynn Ridges, MD;  Location: [facility] OR;  Service: General;  Laterality: N/A;  . LAPAROTOMY   06/02/2012   Procedure: EXPLORATORY LAPAROTOMY;  Surgeon: Cherylynn Ridges, MD;  Location: [facility] OR;  Service: General;  Laterality: N/A;  . NO PAST SURGERIES    . PARTIAL COLECTOMY  06/02/2012   Procedure: PARTIAL COLECTOMY;  Surgeon: Cherylynn Ridges, MD;  Location: [facility] OR;  Service: General;  Laterality: Right;    History reviewed. No pertinent family history.  Social History   Tobacco Use  . Smoking status: Never Smoker  . Smokeless tobacco: Never Used  Substance Use Topics  . Alcohol use: No  . Drug use: No    Allergies: No Known Allergies  Medications Prior to Admission  Medication Sig Dispense Refill Last Dose  . acetaminophen (TYLENOL) 325 MG tablet Take 2 tablets (650 mg total) by mouth every 6 (six) hours as needed (or Temp > 100).   Past Week at Unknown time  . divalproex (DEPAKOTE ER) 500 MG 24 hr tablet Take 1,000 mg by mouth at bedtime.   07/12/2013 at Unknown time  . GILDESS FE 1/20 1-20 MG-MCG tablet Take 1 tablet by mouth daily.    07/13/2013 at Unknown time  . lamoTRIgine (LAMICTAL) 100 MG tablet Take 100 mg by mouth at bedtime.   07/12/2013  at Unknown time  . omeprazole (PRILOSEC) 20 MG capsule Take 1 capsule (20 mg total) by mouth daily. 10 capsule 0     Review of Systems  Constitutional: Negative for chills, fatigue and fever.  Respiratory: Negative for shortness of breath.   Gastrointestinal: Positive for abdominal pain.  Genitourinary: Negative for difficulty urinating, dyspareunia, dysuria, vaginal bleeding, vaginal discharge and vaginal pain.  Musculoskeletal: Negative for back pain.  Neurological: Positive for headaches. Negative for syncope and weakness.  All other systems reviewed and are negative.  Physical Exam   Blood pressure 136/83, pulse 79, temperature 98.6 F (37 C), temperature source Oral, resp. rate 18, height 5\' 2"  (1.575 m), weight 75.8 kg, last menstrual period 01/06/2018, SpO2 100 %.  Physical Exam  Nursing note and vitals  reviewed. Constitutional: She is oriented to person, place, and time. She appears well-developed and well-nourished.  Cardiovascular: Normal rate.  Respiratory: Effort normal.  GI: She exhibits no distension. There is no abdominal tenderness. There is no rebound and no guarding.  Gravid  Genitourinary:    Genitourinary Comments: Not evaluated. Cervical exam performed by RN per labor check protocol   Musculoskeletal: Normal range of motion.  Neurological: She is alert and oriented to person, place, and time. She has normal strength and normal reflexes. No sensory deficit.  Skin: Skin is warm and dry.  Psychiatric: She has a normal mood and affect. Her behavior is normal. Judgment and thought content normal.    MAU Course/MDM  Procedures  --New onset elevated BP x 2, no severe range --History of allergy-related headaches, otherwise asymptomatic. Headache resolving with spacing of contractions --Reactive tracing: baseline 135, moderate variability, positive 15 x 15 accels, no decels --Toco: irregular, palpate mild, q 2-7 min --Prenatal records scanned into Media tab up to 08/21/18. Reviewed by me today. Normotensive throughout  Patient Vitals for the past 24 hrs:  BP Temp Temp src Pulse Resp SpO2 Height Weight  09/18/18 1301 116/75 - - 67 - - - -  09/18/18 1247 103/71 - - - - - - -  09/18/18 1231 117/79 - - 85 - - - -  09/18/18 1216 125/78 - - 88 - - - -  09/18/18 1201 136/83 - - 79 - - - -  09/18/18 1146 140/82 - - 92 - - - -  09/18/18 1137 (!) 153/95 98.6 F (37 C) Oral 96 18 100 % 5\' 2"  (1.575 m) 75.8 kg    Meds ordered this encounter  Medications  . acetaminophen (TYLENOL) tablet 1,000 mg  . loratadine (CLARITIN) tablet 10 mg    Results for orders placed or performed during the hospital encounter of 09/18/18 (from the past 24 hour(s))  CBC with Differential/Platelet     Status: Abnormal   Collection Time: 09/18/18 11:57 AM  Result Value Ref Range   WBC 9.5 4.0 - 10.5 K/uL    RBC 4.75 3.87 - 5.11 MIL/uL   Hemoglobin 13.8 12.0 - 15.0 g/dL   HCT 03.7 04.8 - 88.9 %   MCV 87.6 80.0 - 100.0 fL   MCH 29.1 26.0 - 34.0 pg   MCHC 33.2 30.0 - 36.0 g/dL   RDW 16.9 45.0 - 38.8 %   Platelets 321 150 - 400 K/uL   nRBC 0.0 0.0 - 0.2 %   Neutrophils Relative % 73 %   Neutro Abs 6.8 1.7 - 7.7 K/uL   Lymphocytes Relative 21 %   Lymphs Abs 2.0 0.7 - 4.0 K/uL   Monocytes Relative 5 %  Monocytes Absolute 0.5 0.1 - 1.0 K/uL   Eosinophils Relative 0 %   Eosinophils Absolute 0.0 0.0 - 0.5 K/uL   Basophils Relative 0 %   Basophils Absolute 0.0 0.0 - 0.1 K/uL   Immature Granulocytes 1 %   Abs Immature Granulocytes 0.11 (H) 0.00 - 0.07 K/uL  Comprehensive metabolic panel     Status: Abnormal   Collection Time: 09/18/18 11:57 AM  Result Value Ref Range   Sodium 136 135 - 145 mmol/L   Potassium 4.1 3.5 - 5.1 mmol/L   Chloride 105 98 - 111 mmol/L   CO2 22 22 - 32 mmol/L   Glucose, Bld 79 70 - 99 mg/dL   BUN 6 6 - 20 mg/dL   Creatinine, Ser 4.09 0.44 - 1.00 mg/dL   Calcium 9.2 8.9 - 81.1 mg/dL   Total Protein 6.6 6.5 - 8.1 g/dL   Albumin 3.0 (L) 3.5 - 5.0 g/dL   AST 22 15 - 41 U/L   ALT 13 0 - 44 U/L   Alkaline Phosphatase 105 38 - 126 U/L   Total Bilirubin 1.0 0.3 - 1.2 mg/dL   GFR calc non Af Amer >60 >60 mL/min   GFR calc Af Amer >60 >60 mL/min   Anion gap 9 5 - 15  Protein / creatinine ratio, urine     Status: None   Collection Time: 09/18/18 12:01 PM  Result Value Ref Range   Creatinine, Urine 95.44 mg/dL   Total Protein, Urine 8 mg/dL   Protein Creatinine Ratio 0.08 0.00 - 0.15 mg/mg[Cre]  Urinalysis, Routine w reflex microscopic     Status: Abnormal   Collection Time: 09/18/18 12:01 PM  Result Value Ref Range   Color, Urine YELLOW YELLOW   APPearance HAZY (A) CLEAR   Specific Gravity, Urine 1.011 1.005 - 1.030   pH 7.0 5.0 - 8.0   Glucose, UA NEGATIVE NEGATIVE mg/dL   Hgb urine dipstick NEGATIVE NEGATIVE   Bilirubin Urine NEGATIVE NEGATIVE   Ketones,  ur NEGATIVE NEGATIVE mg/dL   Protein, ur NEGATIVE NEGATIVE mg/dL   Nitrite NEGATIVE NEGATIVE   Leukocytes,Ua SMALL (A) NEGATIVE   RBC / HPF 0-5 0 - 5 RBC/hpf   WBC, UA 0-5 0 - 5 WBC/hpf   Bacteria, UA RARE (A) NONE SEEN   Squamous Epithelial / LPF 6-10 0 - 5   Mucus PRESENT     Assessment and Plan  --23 y.o. G1P0 at [redacted]w[redacted]d  --Reactive tracing --Cervix unchanged at 1cm --Negative PEC labs --Headache resolved with Tylenol and spacing of contractions --Discharge home in stable condition  F/U: Patient has next OB appt early next week. Appropriate timing for followup due to resolution of symptoms with spacing of contractions and allergy treament  Calvert Cantor, CNM 09/18/2018, 1:32 PM

## 2018-09-18 NOTE — MAU Note (Addendum)
Pt presents to MAU with c/o ctx that started this morning around 10 am, have been less than 5 min apart. Pt was seen in the office yesterday and cervix was closed. She denies VB and LOF. Pt also complaining of vomiting an diarrhea today. +FM

## 2018-09-19 LAB — CULTURE, OB URINE: Culture: >=100000 — AB

## 2018-09-28 ENCOUNTER — Inpatient Hospital Stay (HOSPITAL_COMMUNITY)
Admission: AD | Admit: 2018-09-28 | Discharge: 2018-09-30 | DRG: 807 | Disposition: A | Discharge status: Home / Self Care | Payer: BLUE CROSS/BLUE SHIELD | Attending: Obstetrics and Gynecology | Admitting: Obstetrics and Gynecology

## 2018-09-28 ENCOUNTER — Encounter (HOSPITAL_COMMUNITY): Payer: Self-pay | Admitting: *Deleted

## 2018-09-28 ENCOUNTER — Other Ambulatory Visit: Payer: Self-pay

## 2018-09-28 ENCOUNTER — Inpatient Hospital Stay (HOSPITAL_COMMUNITY): Payer: BLUE CROSS/BLUE SHIELD | Admitting: Anesthesiology

## 2018-09-28 DIAGNOSIS — O26893 Other specified pregnancy related conditions, third trimester: Secondary | ICD-10-CM | POA: Diagnosis present

## 2018-09-28 DIAGNOSIS — Z3A37 37 weeks gestation of pregnancy: Secondary | ICD-10-CM

## 2018-09-28 DIAGNOSIS — O9989 Other specified diseases and conditions complicating pregnancy, childbirth and the puerperium: Secondary | ICD-10-CM | POA: Diagnosis not present

## 2018-09-28 DIAGNOSIS — R030 Elevated blood-pressure reading, without diagnosis of hypertension: Secondary | ICD-10-CM

## 2018-09-28 DIAGNOSIS — Z3A36 36 weeks gestation of pregnancy: Secondary | ICD-10-CM | POA: Diagnosis not present

## 2018-09-28 DIAGNOSIS — O99824 Streptococcus B carrier state complicating childbirth: Secondary | ICD-10-CM | POA: Diagnosis present

## 2018-09-28 HISTORY — DX: Major depressive disorder, single episode, unspecified: F32.9

## 2018-09-28 HISTORY — DX: Anxiety disorder, unspecified: F41.9

## 2018-09-28 HISTORY — DX: Depression, unspecified: F32.A

## 2018-09-28 LAB — CBC
HCT: 38.3 % (ref 36.0–46.0)
Hemoglobin: 12.7 g/dL (ref 12.0–15.0)
MCH: 29.5 pg (ref 26.0–34.0)
MCHC: 33.2 g/dL (ref 30.0–36.0)
MCV: 89.1 fL (ref 80.0–100.0)
Platelets: 267 10*3/uL (ref 150–400)
RBC: 4.3 MIL/uL (ref 3.87–5.11)
RDW: 12.8 % (ref 11.5–15.5)
WBC: 10.1 10*3/uL (ref 4.0–10.5)
nRBC: 0 % (ref 0.0–0.2)

## 2018-09-28 LAB — RPR: RPR Ser Ql: NONREACTIVE

## 2018-09-28 LAB — TYPE AND SCREEN
ABO/RH(D): B POS
Antibody Screen: NEGATIVE

## 2018-09-28 MED ORDER — OXYCODONE-ACETAMINOPHEN 5-325 MG PO TABS: 2.0000 | ORAL_TABLET | ORAL | Status: DC | PRN

## 2018-09-28 MED ORDER — PRENATAL MULTIVITAMIN CH
1.0000 | ORAL_TABLET | Freq: Every day | ORAL | Status: DC
  Administered 2018-09-28 – 2018-09-29 (×2): 1 via ORAL
  Filled 2018-09-28 (×2): qty 1

## 2018-09-28 MED ORDER — MEDROXYPROGESTERONE ACETATE 150 MG/ML IM SUSP: 150.0000 mg | INTRAMUSCULAR | Status: DC | PRN

## 2018-09-28 MED ORDER — IBUPROFEN 600 MG PO TABS
600.0000 mg | ORAL_TABLET | Freq: Four times a day (QID) | ORAL | Status: DC
  Administered 2018-09-28 – 2018-09-30 (×8): 600 mg via ORAL
  Filled 2018-09-28 (×8): qty 1

## 2018-09-28 MED ORDER — LIDOCAINE-EPINEPHRINE (PF) 2 %-1:200000 IJ SOLN
INTRAMUSCULAR | Status: DC | PRN
  Administered 2018-09-28 (×2): 3 mL via EPIDURAL

## 2018-09-28 MED ORDER — SERTRALINE HCL 25 MG PO TABS
25.0000 mg | ORAL_TABLET | Freq: Every day | ORAL | Status: DC
  Administered 2018-09-28 – 2018-09-29 (×2): 25 mg via ORAL
  Filled 2018-09-28 (×2): qty 1

## 2018-09-28 MED ORDER — SODIUM CHLORIDE 0.9 % IV SOLN
5.0000 10*6.[IU] | Freq: Once | INTRAVENOUS | Status: AC
  Administered 2018-09-28: 5 10*6.[IU] via INTRAVENOUS
  Filled 2018-09-28: qty 5

## 2018-09-28 MED ORDER — BUTORPHANOL TARTRATE 1 MG/ML IJ SOLN
1.0000 mg | INTRAMUSCULAR | Status: DC | PRN
  Administered 2018-09-28: 1 mg via INTRAVENOUS
  Filled 2018-09-28: qty 1

## 2018-09-28 MED ORDER — PHENYLEPHRINE 40 MCG/ML (10ML) SYRINGE FOR IV PUSH (FOR BLOOD PRESSURE SUPPORT): 80.0000 ug | PREFILLED_SYRINGE | INTRAVENOUS | Status: DC | PRN

## 2018-09-28 MED ORDER — MEASLES, MUMPS & RUBELLA VAC IJ SOLR: 0.5000 mL | Freq: Once | INTRAMUSCULAR | Status: DC

## 2018-09-28 MED ORDER — BENZOCAINE-MENTHOL 20-0.5 % EX AERO: 1.0000 "application " | INHALATION_SPRAY | CUTANEOUS | Status: DC | PRN

## 2018-09-28 MED ORDER — FLEET ENEMA 7-19 GM/118ML RE ENEM: 1.0000 | ENEMA | RECTAL | Status: DC | PRN

## 2018-09-28 MED ORDER — LACTATED RINGERS IV SOLN: 500.0000 mL | INTRAVENOUS | Status: DC | PRN

## 2018-09-28 MED ORDER — ACETAMINOPHEN 325 MG PO TABS: 650.0000 mg | ORAL_TABLET | ORAL | Status: DC | PRN

## 2018-09-28 MED ORDER — OXYCODONE-ACETAMINOPHEN 5-325 MG PO TABS: 1.0000 | ORAL_TABLET | ORAL | Status: DC | PRN

## 2018-09-28 MED ORDER — LACTATED RINGERS IV SOLN
INTRAVENOUS | Status: DC
  Administered 2018-09-28 (×2): via INTRAVENOUS

## 2018-09-28 MED ORDER — DIPHENHYDRAMINE HCL 25 MG PO CAPS: 25.0000 mg | ORAL_CAPSULE | Freq: Four times a day (QID) | ORAL | Status: DC | PRN

## 2018-09-28 MED ORDER — EPHEDRINE 5 MG/ML INJ: 10.0000 mg | INTRAVENOUS | Status: DC | PRN

## 2018-09-28 MED ORDER — ONDANSETRON HCL 4 MG/2ML IJ SOLN
4.0000 mg | Freq: Four times a day (QID) | INTRAMUSCULAR | Status: DC | PRN
  Administered 2018-09-28: 04:00:00 4 mg via INTRAVENOUS
  Filled 2018-09-28: qty 2

## 2018-09-28 MED ORDER — COCONUT OIL OIL
1.0000 "application " | TOPICAL_OIL | Status: DC | PRN
  Administered 2018-09-28: 1 via TOPICAL

## 2018-09-28 MED ORDER — PENICILLIN G 3 MILLION UNITS IVPB - SIMPLE MED
3.0000 10*6.[IU] | INTRAVENOUS | Status: DC
  Administered 2018-09-28: 3 10*6.[IU] via INTRAVENOUS
  Filled 2018-09-28: qty 100

## 2018-09-28 MED ORDER — TETANUS-DIPHTH-ACELL PERTUSSIS 5-2.5-18.5 LF-MCG/0.5 IM SUSP: 0.5000 mL | Freq: Once | INTRAMUSCULAR | Status: DC

## 2018-09-28 MED ORDER — PHENYLEPHRINE 40 MCG/ML (10ML) SYRINGE FOR IV PUSH (FOR BLOOD PRESSURE SUPPORT)
80.0000 ug | PREFILLED_SYRINGE | INTRAVENOUS | Status: DC | PRN
  Filled 2018-09-28: qty 10

## 2018-09-28 MED ORDER — LACTATED RINGERS IV SOLN
500.0000 mL | Freq: Once | INTRAVENOUS | Status: AC
  Administered 2018-09-28: 06:00:00 500 mL via INTRAVENOUS

## 2018-09-28 MED ORDER — SIMETHICONE 80 MG PO CHEW: 80.0000 mg | CHEWABLE_TABLET | ORAL | Status: DC | PRN

## 2018-09-28 MED ORDER — OXYTOCIN 40 UNITS IN NORMAL SALINE INFUSION - SIMPLE MED
2.5000 [IU]/h | INTRAVENOUS | Status: DC
  Administered 2018-09-28: 11:00:00 2.5 [IU]/h via INTRAVENOUS

## 2018-09-28 MED ORDER — TERBUTALINE SULFATE 1 MG/ML IJ SOLN: 0.2500 mg | Freq: Once | INTRAMUSCULAR | Status: DC | PRN

## 2018-09-28 MED ORDER — SODIUM CHLORIDE (PF) 0.9 % IJ SOLN
INTRAMUSCULAR | Status: DC | PRN
  Administered 2018-09-28: 12 mL/h via EPIDURAL

## 2018-09-28 MED ORDER — SENNOSIDES-DOCUSATE SODIUM 8.6-50 MG PO TABS
2.0000 | ORAL_TABLET | ORAL | Status: DC
  Administered 2018-09-29 (×2): 2 via ORAL
  Filled 2018-09-28 (×2): qty 2

## 2018-09-28 MED ORDER — LIDOCAINE HCL (PF) 1 % IJ SOLN: 30.0000 mL | INTRAMUSCULAR | Status: DC | PRN

## 2018-09-28 MED ORDER — DIPHENHYDRAMINE HCL 50 MG/ML IJ SOLN: 12.5000 mg | INTRAMUSCULAR | Status: DC | PRN

## 2018-09-28 MED ORDER — OXYTOCIN BOLUS FROM INFUSION
500.0000 mL | Freq: Once | INTRAVENOUS | Status: AC
  Administered 2018-09-28: 500 mL via INTRAVENOUS

## 2018-09-28 MED ORDER — SOD CITRATE-CITRIC ACID 500-334 MG/5ML PO SOLN: 30.0000 mL | ORAL | Status: DC | PRN

## 2018-09-28 MED ORDER — OXYTOCIN 40 UNITS IN NORMAL SALINE INFUSION - SIMPLE MED
1.0000 m[IU]/min | INTRAVENOUS | Status: DC
  Administered 2018-09-28: 2 m[IU]/min via INTRAVENOUS
  Filled 2018-09-28: qty 1000

## 2018-09-28 MED ORDER — DIBUCAINE 1 % RE OINT: 1.0000 "application " | TOPICAL_OINTMENT | RECTAL | Status: DC | PRN

## 2018-09-28 MED ORDER — WITCH HAZEL-GLYCERIN EX PADS: 1.0000 "application " | MEDICATED_PAD | CUTANEOUS | Status: DC | PRN

## 2018-09-28 MED ORDER — FENTANYL-BUPIVACAINE-NACL 0.5-0.125-0.9 MG/250ML-% EP SOLN
12.0000 mL/h | EPIDURAL | Status: DC | PRN
  Filled 2018-09-28: qty 250

## 2018-09-28 MED ORDER — ONDANSETRON HCL 4 MG/2ML IJ SOLN: 4.0000 mg | INTRAMUSCULAR | Status: DC | PRN

## 2018-09-28 MED ORDER — ONDANSETRON HCL 4 MG PO TABS: 4.0000 mg | ORAL_TABLET | ORAL | Status: DC | PRN

## 2018-09-28 NOTE — H&P (Signed)
Joanne Herrera is a 23 y.o. female presenting for SOL.  She is now comfortable w/ epidural.  Prengnacy uncomplicated.   OB History    Gravida  1   Para      Term      Preterm      AB      Living        SAB      TAB      Ectopic      Multiple      Live Births             Past Medical History:  Diagnosis Date  . Abdominal pain, acute, right lower quadrant 02/11/2012    Possible Appendix perforation, contained perforation, typhlitis, or Crohn's dz.    . Bipolar depression (HCC)    Past Surgical History:  Procedure Laterality Date  . APPENDECTOMY    . COLONOSCOPY    . LAPAROSCOPY  06/02/2012   Procedure: LAPAROSCOPY DIAGNOSTIC;  Surgeon: Cherylynn Ridges, MD;  Location: [facility] OR;  Service: General;  Laterality: N/A;  . LAPAROTOMY  06/02/2012   Procedure: EXPLORATORY LAPAROTOMY;  Surgeon: Cherylynn Ridges, MD;  Location: [facility] OR;  Service: General;  Laterality: N/A;  . NO PAST SURGERIES    . PARTIAL COLECTOMY  06/02/2012   Procedure: PARTIAL COLECTOMY;  Surgeon: Cherylynn Ridges, MD;  Location: [facility] OR;  Service: General;  Laterality: Right;   Family History: family history is not on file. Social History:  reports that she has never smoked. She has never used smokeless tobacco. She reports that she does not drink alcohol or use drugs.     Maternal Diabetes: No Genetic Screening: Normal Maternal Ultrasounds/Referrals: Normal Fetal Ultrasounds or other Referrals:  None Maternal Substance Abuse:  No Significant Maternal Medications:  None Significant Maternal Lab Results:  None Other Comments:  None  ROS History Dilation: Lip/rim Effacement (%): 100 Station: 0 Exam by:: Dr Vickey Sages Blood pressure 127/87, pulse (!) 105, temperature 97.7 F (36.5 C), temperature source Oral, resp. rate 18, height 5\' 2"  (1.575 m), weight 76.7 kg, last menstrual period 01/06/2018, SpO2 100 %. Exam Physical Exam  Prenatal labs: ABO, Rh: --/--/B POS (04/05 0318) Antibody: NEG (04/05  0318) Rubella:   RPR:    HBsAg:    HIV:    GBS: Positive (03/25 0000)   Assessment/Plan: Admit Epidural prn PCN for GBS + Exp mngt   Joanne Herrera 09/28/2018, 8:18 AM

## 2018-09-28 NOTE — Progress Notes (Signed)
SVD of vigorous female infant w/ apgars of 9,9.  Placenta delivered spontaneous w/ 3VC.   1st degree & bilateral labial lac repaired w/ 3-0 vicryl rapide.  Fundus firm.  Mom and baby skin to skin

## 2018-09-28 NOTE — Lactation Note (Signed)
This note was copied from a baby's chart. Lactation Consultation Note  [name] [id number] Date: 09/28/2018 Reason for consult: Initial assessment;Primapara;1st time breastfeeding;Infant < 6lbs;Early term 58-38.6wks  Visited with P1 Mom of ET infant at 5 hrs old.  Baby [redacted]w[redacted]d.   Baby has latched to breast once in L&D with a latch score of 8.    MBU RN assisted with some attempts, but baby was sleepy.   Currently Mom is sleeping and baby swaddled sleeping in crib.    Spoke to RN about plan as bottles of Neosure in room.  When Mom awakens, she will call for pump set up.  FOB encouraged to remind Mom to call for RN to set up pump.   Before formula supplement given, recommend that Mom hand express and double pump on initiation setting.   Lactation brochure left in room.  Consult Status Consult Status: Follow-up Date: 09/29/18 Follow-up type: In-patient    [name] 09/28/2018, 3:30 PM

## 2018-09-28 NOTE — Anesthesia Procedure Notes (Signed)
Epidural Patient location during procedure: OB Start time: 09/28/2018 6:20 AM End time: 09/28/2018 6:35 AM  Staffing Anesthesiologist: Elmer Picker, MD Performed: anesthesiologist   Preanesthetic Checklist Completed: patient identified, pre-op evaluation, timeout performed, IV checked, risks and benefits discussed and monitors and equipment checked  Epidural Patient position: sitting Prep: site prepped and draped and DuraPrep Patient monitoring: continuous pulse ox, blood pressure, heart rate and cardiac monitor Approach: midline Location: L3-L4 Injection technique: LOR air  Needle:  Needle type: Tuohy  Needle gauge: 17 G Needle length: 9 cm Needle insertion depth: 5 cm Catheter type: closed end flexible Catheter size: 19 Gauge Catheter at skin depth: 10 cm Test dose: negative  Assessment Sensory level: T8 Events: blood not aspirated, injection not painful, no injection resistance, negative IV test and no paresthesia  Additional Notes Patient identified. Risks/Benefits/Options discussed with patient including but not limited to bleeding, infection, nerve damage, paralysis, failed block, incomplete pain control, headache, blood pressure changes, nausea, vomiting, reactions to medication both or allergic, itching and postpartum back pain. Confirmed with bedside nurse the patient's most recent platelet count. Confirmed with patient that they are not currently taking any anticoagulation, have any bleeding history or any family history of bleeding disorders. Patient expressed understanding and wished to proceed. All questions were answered. Sterile technique was used throughout the entire procedure. Please see nursing notes for vital signs. Test dose was given through epidural catheter and negative prior to continuing to dose epidural or start infusion. Warning signs of high block given to the patient including shortness of breath, tingling/numbness in hands, complete motor block, or  any concerning symptoms with instructions to call for help. Patient was given instructions on fall risk and not to get out of bed. All questions and concerns addressed with instructions to call with any issues or inadequate analgesia.  Reason for block:procedure for pain

## 2018-09-28 NOTE — Anesthesia Preprocedure Evaluation (Signed)
Anesthesia Evaluation  Patient identified by MRN, date of birth, ID band Patient awake    Reviewed: Allergy & Precautions, NPO status , Patient's Chart, lab work & pertinent test results  Airway Mallampati: II  TM Distance: >3 FB Neck ROM: Full    Dental no notable dental hx.    Pulmonary neg pulmonary ROS,    Pulmonary exam normal breath sounds clear to auscultation       Cardiovascular negative cardio ROS Normal cardiovascular exam Rhythm:Regular Rate:Normal     Neuro/Psych PSYCHIATRIC DISORDERS Bipolar Disorder negative neurological ROS     GI/Hepatic negative GI ROS, Neg liver ROS,   Endo/Other  negative endocrine ROS  Renal/GU negative Renal ROS  negative genitourinary   Musculoskeletal negative musculoskeletal ROS (+)   Abdominal   Peds  Hematology negative hematology ROS (+)   Anesthesia Other Findings   Reproductive/Obstetrics (+) Pregnancy                             Anesthesia Physical Anesthesia Plan  ASA: II  Anesthesia Plan: Epidural   Post-op Pain Management:    Induction:   PONV Risk Score and Plan: Treatment may vary due to age or medical condition  Airway Management Planned: Natural Airway  Additional Equipment:   Intra-op Plan:   Post-operative Plan:   Informed Consent: I have reviewed the patients History and Physical, chart, labs and discussed the procedure including the risks, benefits and alternatives for the proposed anesthesia with the patient or authorized representative who has indicated his/her understanding and acceptance.       Plan Discussed with: Anesthesiologist  Anesthesia Plan Comments: (Patient identified. Risks, benefits, options discussed with patient including but not limited to bleeding, infection, nerve damage, paralysis, failed block, incomplete pain control, headache, blood pressure changes, nausea, vomiting, reactions to  medication, itching, and post partum back pain. Confirmed with bedside nurse the patient's most recent platelet count. Confirmed with the patient that they are not taking any anticoagulation, have any bleeding history or any family history of bleeding disorders. Patient expressed understanding and wishes to proceed. All questions were answered. )        Anesthesia Quick Evaluation

## 2018-09-28 NOTE — Anesthesia Postprocedure Evaluation (Signed)
Anesthesia Post Note  Patient: Joanne Herrera  Procedure(s) Performed: AN AD HOC LABOR EPIDURAL     Patient location during evaluation: Mother Baby Anesthesia Type: Epidural Level of consciousness: awake and alert Pain management: pain level controlled Vital Signs Assessment: post-procedure vital signs reviewed and stable Respiratory status: spontaneous breathing, nonlabored ventilation and respiratory function stable Cardiovascular status: stable Postop Assessment: no headache, no backache and epidural receding Anesthetic complications: no    Last Vitals:  Vitals:   09/28/18 1001 09/28/18 1015  BP: 110/63 (!) 144/92  Pulse: (!) 114 (!) 112  Resp: 16 18  Temp:    SpO2:      Last Pain:  Vitals:   09/28/18 0801  TempSrc: Oral  PainSc: 0-No pain   Pain Goal: Patients Stated Pain Goal: 0 (09/28/18 0207)                 [name]

## 2018-09-28 NOTE — MAU Note (Addendum)
My water broke about 0120. Clear fld. Continue to leak some fld but not as much. Did wear pad to hosp. Alittle cramping. 3cm on THurs. Has had good FM until SROM

## 2018-09-29 LAB — CBC
HCT: 34.3 % — ABNORMAL LOW (ref 36.0–46.0)
Hemoglobin: 11.2 g/dL — ABNORMAL LOW (ref 12.0–15.0)
MCH: 29.6 pg (ref 26.0–34.0)
MCHC: 32.7 g/dL (ref 30.0–36.0)
MCV: 90.5 fL (ref 80.0–100.0)
Platelets: 218 10*3/uL (ref 150–400)
RBC: 3.79 MIL/uL — ABNORMAL LOW (ref 3.87–5.11)
RDW: 13 % (ref 11.5–15.5)
WBC: 11.4 10*3/uL — ABNORMAL HIGH (ref 4.0–10.5)
nRBC: 0 % (ref 0.0–0.2)

## 2018-09-29 LAB — ABO/RH: ABO/RH(D): B POS

## 2018-09-29 NOTE — Progress Notes (Signed)
Post Partum Day 1 Subjective: no complaints, up ad lib, voiding and tolerating PO  Objective: Blood pressure 119/85, pulse 71, temperature 98.5 F (36.9 C), temperature source Oral, resp. rate 18, height 5\' 2"  (1.575 m), weight 76.7 kg, last menstrual period 01/06/2018, SpO2 100 %, unknown if currently breastfeeding.  Physical Exam:  General: alert, cooperative and appears stated age Lochia: appropriate Uterine Fundus: firm Incision: healing well, no significant drainage, no dehiscence, no significant erythema DVT Evaluation: No evidence of DVT seen on physical exam. Negative Homan's sign. No cords or calf tenderness. No significant calf/ankle edema.  Recent Labs    09/28/18 0310 09/29/18 0445  HGB 12.7 11.2*  HCT 38.3 34.3*    Assessment/Plan: Plan for discharge tomorrow and Circumcision prior to discharge  D/W patient female infant circumcision, risks/benefits reviewed. All questions answered.   LOS: 1 day    Ranae Pila 09/29/2018, 8:16 AM

## 2018-09-29 NOTE — Clinical Social Work Maternal (Signed)
CLINICAL SOCIAL WORK MATERNAL/CHILD NOTE  Patient Details  Name: Joanne Herrera MRN: [id number] Date of Birth: 01/22/1996  Date:  11-01-2018  Clinical Social Worker Initiating Note:  Ollen Barges Date/Time: Initiated:  09/29/18/0906     Child's Name:  Joanne Herrera   Biological Parents:  Mother, Father(Joanne Herrera and Joanne Herrera DOB: 02/03/1991)   Need for Interpreter:  None   Reason for Referral:  Behavioral Health Concerns(history of bipolar depression)   Address:  [place] [address]    Phone number:  [phone] (home)     Additional phone number:   Household Members/Support Persons (HM/SP):   Household Member/Support Person 1   HM/SP Name Relationship DOB or Age  HM/SP -1 Joanne Herrera FOB 02/03/1991  HM/SP -2        HM/SP -3        HM/SP -4        HM/SP -5        HM/SP -6        HM/SP -7        HM/SP -8          Natural Supports (not living in the home):  Immediate Family, Artist Supports:     Employment: Part-time   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:  Rea arranged:    Museum/gallery curator Resources:  Multimedia programmer   Other Resources:      Cultural/Religious Considerations Which May Impact Care:    Strengths:  Ability to meet basic needs , Home prepared for child , Pediatrician chosen   Psychotropic Medications:         Pediatrician:    [facility]  Pediatrician List:   [facility] [place]      Pediatrician Fax Number:    Risk Factors/Current Problems:      Cognitive State:  Able to Concentrate , Alert , Linear Thinking , Insightful    Mood/Affect:  Bright , Comfortable , Calm , Relaxed , Happy , Interested    CSW Assessment: CSW received consult for history of bipolar depression.  CSW met with MOB to offer support and complete  assessment.    MOB up and walking around the room with baby asleep in basinet and FOB resting on the couch, when CSW entered the room. CSW introduced self and role and received verbal permission to complete assessment with FOB present. CSW explained reason for consult and MOB expressed understanding. MOB stated she currently lives with FOB in [place], [place]. MOB stated she is currently employed part-time at the [employer] and works as a Data processing manager. MOB stated her highest level of education completed is some college and plans to go back and get her degree. MOB reported she does not receive WIC or food stamps.   CSW inquired about MOB's mental health history. MOB stated she has a history of bipolar depression since high school but stated she thinks it was just her being a "bratty teenager". MOB stated she received counseling in high school and that she was started on Lamictal and Depakote but stopped taking it in 2016 and denied any concerns since. MOB stated she has started to experience some anxiety as she has gotten older but that it is not severe. MOB reported her OBGYN started her on Zoloft  during her pregnancy because she was having some anxiousness and over-worrying. MOB reported noticing a huge improvement in how she felt, after a week of being on the medication. MOB appeared to be in good spirits and stated she was feeling "good".   CSW provided education regarding the baby blues period vs. perinatal mood disorders, discussed treatment and gave resources for mental health follow up if concerns arise.  CSW recommends self-evaluation during the postpartum time period using the New Mom Checklist from Postpartum Progress and encouraged MOB to contact a medical professional if symptoms are noted at any time. MOB denied any current SI or HI and reported having a good support system consisting of her mom and FOB.     MOB confirmed she had all essential items for infant once home. MOB reported infant had a few  sleeping options once home. CSW provided review of Sudden Infant Death Syndrome (SIDS) precautions and safe sleeping habits. MOB denied any further needs, questions or concerns for CSW at this time.    CSW Plan/Description:  No Further Intervention Required/No Barriers to Discharge, Sudden Infant Death Syndrome (SIDS) Education, Perinatal Mood and Anxiety Disorder (PMADs) Education    Joanne Clites  Herrera, LCSWA 09/29/2018, 10:49 AM 

## 2018-09-29 NOTE — Lactation Note (Signed)
This note was copied from a baby's chart. Lactation Consultation Note  [name] Date: 09/29/2018 Reason for consult: Follow-up assessment;Primapara;Early term 37-38.6wks;Infant < 6lbs  Visited with P1 Mom of ET infant weighing <6 lbs.  Baby at 3% weight loss at 27 hrs old. 3 voids and 5 stools last 24 hrs. Mom holding baby semi-STS, and baby not rooting.  It has been 3-4 hrs since last feeding, so recommended that baby is fed.  Mom expressed 8 ml EBM earlier.  Recommended they feed this first to baby, following with 10-15 ml of Neosure per volume guidelines.  Reviewed paced bottle feeding with parents.  Reviewed importance of disassembling pump parts and washing, rinsing, and air drying pump parts.  2nd basin given with paper towel for drying, along with a toothbrush for scrubbing pump parts.    Mom has a DEBP for home use.    Plan- 1- Keep baby STS as much as possible 2- Offer baby the breast with any cue, goal is >8 feedings per 24 hrs. 3- offer supplement EBM+/formula per volume guidelines, 10-20 ml today 4- Pump both breasts 15 mins on initiation setting 5- ask for assistance prn.   Interventions Interventions: Breast feeding basics reviewed;DEBP;Skin to skin;Breast massage;Hand express;Expressed milk  Lactation Tools Discussed/Used Tools: Pump;Bottle Breast pump type: Double-Electric Breast Pump Pump Review: Setup, frequency, and cleaning;Milk Storage Initiated by:: RN Date initiated:: 09/28/18   Consult Status Consult Status: Follow-up Date: 09/30/18 Follow-up type: In-patient    Joanne Herrera 09/29/2018, 1:22 PM

## 2018-09-30 NOTE — Lactation Note (Signed)
This note was copied from a baby's chart. Lactation Consultation Note  [name] JOACZ'Y Date: 09/30/2018   P1, Baby 47 hours old.  < 6 lbs. Mother states baby recently breastfed for an hour and then she supplemented w/ 23 ml of breastmilk. Discussed watching for NNS versus nutritive feeding.  Allow baby to rest and not hang out on breast.  Give supplementation after 30 min. Praised mother for her efforts and encouraged her to continue post pumping. Feed on demand approximately 8-12 times per day at least q 3 hours.   Family has DEBP at home. Reviewed engorgement care and monitoring voids/stools. Encouraged family to call if further assistance is needed.      Maternal Data    Feeding Feeding Type: Breast Fed  LATCH Score                   Interventions    Lactation Tools Discussed/Used     Consult Status      Hardie Pulley 09/30/2018, 9:55 AM

## 2018-09-30 NOTE — Discharge Summary (Signed)
Obstetric Discharge Summary Reason for Admission: onset of labor Prenatal Procedures: none Intrapartum Procedures: spontaneous vaginal delivery Postpartum Procedures: none Complications-Operative and Postpartum: 1st degree perineal laceration Hemoglobin  Date Value Ref Range Status  09/29/2018 11.2 (L) 12.0 - 15.0 g/dL Final   HCT  Date Value Ref Range Status  09/29/2018 34.3 (L) 36.0 - 46.0 % Final    Physical Exam:  General: alert, cooperative, appears stated age and no distress Lochia: appropriate Uterine Fundus: firm Incision: healing well DVT Evaluation: No evidence of DVT seen on physical exam.  Discharge Diagnoses: Term Pregnancy-delivered  Discharge Information: Date: 09/30/2018 Activity: pelvic rest Diet: routine Medications: None Condition: stable Instructions: refer to practice specific booklet Discharge to: home   Newborn Data: Live born female  Birth Weight: 5 lb 10.1 oz (2554 g) APGAR: 9, 9  Newborn Delivery   Birth date/time:  09/28/2018 09:58:00 Delivery type:  Vaginal, Spontaneous     Home with mother.  Turner Daniels 09/30/2018, 9:21 AM

## 2018-10-07 ENCOUNTER — Inpatient Hospital Stay (HOSPITAL_COMMUNITY)
Admission: AD | Admit: 2018-10-07 | Payer: BLUE CROSS/BLUE SHIELD | Source: Home / Self Care | Admitting: Obstetrics and Gynecology

## 2018-10-07 ENCOUNTER — Inpatient Hospital Stay (HOSPITAL_COMMUNITY): Payer: BLUE CROSS/BLUE SHIELD

## 2019-12-11 ENCOUNTER — Other Ambulatory Visit: Payer: Self-pay | Admitting: Radiology

## 2019-12-11 DIAGNOSIS — N6320 Unspecified lump in the left breast, unspecified quadrant: Secondary | ICD-10-CM

## 2019-12-22 ENCOUNTER — Ambulatory Visit
Admission: RE | Admit: 2019-12-22 | Discharge: 2019-12-22 | Disposition: A | Discharge status: Home / Self Care | Payer: 59 | Source: Ambulatory Visit | Attending: Radiology | Admitting: Radiology

## 2019-12-22 ENCOUNTER — Other Ambulatory Visit: Payer: Self-pay

## 2019-12-22 DIAGNOSIS — N6320 Unspecified lump in the left breast, unspecified quadrant: Secondary | ICD-10-CM

## 2020-06-02 ENCOUNTER — Other Ambulatory Visit: Payer: Self-pay

## 2020-06-06 ENCOUNTER — Other Ambulatory Visit: Payer: Self-pay | Admitting: Obstetrics and Gynecology

## 2020-06-06 DIAGNOSIS — Z3A14 14 weeks gestation of pregnancy: Secondary | ICD-10-CM

## 2020-06-06 DIAGNOSIS — Z363 Encounter for antenatal screening for malformations: Secondary | ICD-10-CM

## 2020-06-06 DIAGNOSIS — O280 Abnormal hematological finding on antenatal screening of mother: Secondary | ICD-10-CM

## 2020-06-09 ENCOUNTER — Other Ambulatory Visit: Payer: Self-pay

## 2020-06-09 ENCOUNTER — Encounter: Payer: Self-pay | Admitting: *Deleted

## 2020-06-09 ENCOUNTER — Ambulatory Visit: Payer: 59 | Admitting: *Deleted

## 2020-06-09 ENCOUNTER — Other Ambulatory Visit: Payer: Self-pay | Admitting: Obstetrics and Gynecology

## 2020-06-09 ENCOUNTER — Ambulatory Visit: Payer: 59 | Attending: Obstetrics and Gynecology

## 2020-06-09 ENCOUNTER — Ambulatory Visit: Payer: Self-pay | Admitting: Genetic Counselor

## 2020-06-09 ENCOUNTER — Ambulatory Visit (HOSPITAL_BASED_OUTPATIENT_CLINIC_OR_DEPARTMENT_OTHER): Payer: 59 | Admitting: Genetic Counselor

## 2020-06-09 VITALS — BP 123/72 | HR 87

## 2020-06-09 DIAGNOSIS — Z315 Encounter for genetic counseling: Secondary | ICD-10-CM | POA: Diagnosis present

## 2020-06-09 DIAGNOSIS — O285 Abnormal chromosomal and genetic finding on antenatal screening of mother: Secondary | ICD-10-CM | POA: Diagnosis present

## 2020-06-09 DIAGNOSIS — Z363 Encounter for antenatal screening for malformations: Secondary | ICD-10-CM

## 2020-06-09 DIAGNOSIS — O280 Abnormal hematological finding on antenatal screening of mother: Secondary | ICD-10-CM | POA: Diagnosis present

## 2020-06-09 DIAGNOSIS — Z3A14 14 weeks gestation of pregnancy: Secondary | ICD-10-CM | POA: Diagnosis present

## 2020-06-09 NOTE — Progress Notes (Signed)
06/09/2020  Joanne Herrera 1995/07/12 MRN: [id number] DOV: 06/09/2020  [name] presented to the [facility] for a genetics consultation regarding first trimester screening results that were high risk for Down syndrome. [name] was accompanied to her appointment by her Joanne Herrera Joanne Herrera.   Indication for genetic counseling - First trimester screening high risk for Down syndrome  Prenatal history  [name] is a G2P1001, 24 y.o. female. Her current pregnancy has completed [redacted]w[redacted]d (Estimated Date of Delivery: 12/08/20). [name] have a one year old son together.  [name] denied exposure to environmental toxins or chemical agents. She denied the use of alcohol, tobacco or street drugs. She reported taking prenatal vitamins, Sertraline, and nausea medication. She denied significant viral illnesses, fevers, and bleeding during the course of her pregnancy. Her medical and surgical histories were noncontributory.  Family History  A three generation pedigree was drafted and reviewed. The family history is remarkable for the following:  - [name] has a personal history of unilateral Duane syndrome. He also had speech delays requiring speech therapy as a child. Duane syndrome is a congenital eye movement disorder characterized by limitations in horizontal eye movement. We discussed that most cases of Duane syndrome are isolated and sporadic. However, ~10% of cases are familial and may be inherited in an autosomal dominant or autosomal recessive fashion. Given that no one else in [name] family has Duane syndrome, his was likely sporadic. However, his children may have up to a 25-50% chance of also being affected.  - [name] has a maternal aunt who was born with a congenital heart defect (CHD) that required surgical repair early in life. CHDs are most often multifactorial in etiology, but can also result from chromosome aberrations, single gene  conditions, or teratogenic exposures. We discussed that isolated, nonsyndromic CHDs occur in ~0.5-1% of the general population. If [name] aunt had an isolated CHD, the risk of recurrence for his children may be slightly elevated over the general population risk. If however, his aunt had an underlying genetic condition that caused the CHD, the risk of recurrence could be increased. In this case, the specific chance of recurrence would depend on the inheritance of the condition. Without further information, an accurate risk assessment cannot be provided.   The remaining family histories were reviewed and found to be noncontributory for birth defects, intellectual disability, recurrent pregnancy loss, and known genetic conditions. Joanne Herrera was adopted and has no information about her biological family history. [name] also had limited information about portions of his family history; thus, risk assessment was limited.  The patient's ethnicity is Hispanic. The father of the pregnancy's ancestry is Caucasian. Ashkenazi Jewish ancestry and consanguinity were denied. Pedigree will be scanned under Media.  Discussion  First trimester screen results:  [name] was referred for genetic counseling as results from first trimester screening came back positive for an increased risk of trisomy 64, aka Down syndrome, in the current pregnancy. Based on results from first trimester screening, the risk for Down syndrome in the current pregnancy increased from [name]'s 1 in 868 age-related risk to 1 in 170 (0.6%). Additionally, the risk for trisomy 13/18 in the current pregnancy was decreased to less than 1 in 10,000 based on the results from this screen.   Down syndrome is one of the most common extra chromosome conditions, as approximately 1 in 800 babies are born with this condition.There are different types  ofDown syndrome, witheach type determined by the arrangement of the chromosome 21 pair. Most cases  are caused by an entire extra copy of chromosome 21 (trisomy 21). We reviewed that Down syndrome most commonly occurs by chance due to an error in chromosomal division during the formation of egg and sperm cells in a process called nondisjunction.   We discussed that Down syndrome is characterized by a distinctive facial appearance, mild to moderate intellectual disability, and an increased chance for a heart defect. Approximately half of babies with Down syndrome are born with a heart defect that may require surgery after birth. While many children with Down syndrome look similar to each other, each child with Down syndrome is unique and will have many more features in common with his or her own family members. Children with Down syndrome also have an increased chance for thyroid problems, which can range from an underactive to an overactive thyroid. Additionally, low muscle tone, gastrointestinal abnormalities, vision problems, and respiratory and ear infections are more common among babies with Down syndrome. We discussed that there are many more features that can be associated with Down syndrome; however, it is not possible to accurately predict all features that would be present in an individual with Down syndrome prenatally. Additionally, there is a high degree of variability seen among children who have this condition, meaning that every child with Down syndrome will not be affected in exactly the same way, and some children will have more or less features than others. It is not possible to predict what strengths and weaknesses a child with Down syndrome will have, just like it is not possible to predict this for any child.  We discussed that first trimester screeningmeasures levels of hormones that are made by the body during pregnancy. One of these hormones is calledhuman chorionic gonadotropin (hCG). Another of these hormones is called pregnancy-associated plasma protein A (PAPP-A).  JoanneJoanne Herrera'shCGlevel was around normal (1.14 MoM) and her PAPP-A level was slightly decreased (0.69 MoM). Increasedlevels of hCG and decreased levels of PAPP-A can be seen in pregnancies affected by Down syndrome. We also discussed that a nuchal translucency measurement and maternal age are used in addition to patterns of these hormones to calculate the risk for Down syndrome in a pregnancy. The nuchal translucency measurement noted at [name]'s OBGYN office was 2.8 mm. A complete first trimester anatomy ultrasound was performed today which confirmed a nuchal translucency measurement of 2.8 mm. There were no other anomalies or markers of aneuploidy identified on ultrasound at this early gestational age.   Nuchal translucency:  We discussed that nuchal translucency (NT) is the accumulation of fluid present in the back of the fetal neck. This finding is present in all fetuses and can be increased in fetuses with chromosomal aneuploidies such as Down syndrome. The specific NT measurement cutoffs used for normal versus abnormal vary from lab to lab. Many laboratories use a cutoff of 3.0 mm (95th percentile) to define the upper limit of the normal range between 11-14 weeks' gestation. We discussed that since the NT measurement on today's ultrasound is 2.8, it falls into the upper limit of normal.   Additional screening/testing options:  We reviewed additional available screening and testing options for chromosomal aneuploidies such as trisomy 64. Firstly, [name] will have an anatomy ultrasound performed around 18-20 weeks' gestation to assess for additional fetal anomalies or markers suggestive of aneuploidy. [name] counseled that~50% of fetuses with Down syndrome demonstrate a sign of the condition on anatomy  ultrasound. She understands that this also means that 50% of fetuses with Down syndrome will not demonstrate a sign of the condition on ultrasound.  Secondly, we reviewed noninvasive  prenatal screening (NIPS) as another available aneuploidy screening option. Specifically, we discussed that NIPS analyzes cell free DNA originating from the placenta that is found in the maternal blood circulation during pregnancyinstead of hormone levels. NIPS is not diagnostic for chromosome conditions, but can provide information regarding the presence or absence of extra fetal DNA for chromosomes 13, 18, 21, and the sex chromosomes. Thus, it would not identify or rule out all fetal aneuploidy. The reported detection rate is 91-99% for trisomies21, 18, 13, and sex chromosome aneuploidies, which is higher than the detection rate associated with first trimester screening. The false positive rate is reported to be less than 0.1% for any of the conditionson NIPS, which is lower than the false positive rate associated with first trimester screening. JoanneBeaneindicated that she is interested in pursuing NIPS.   Finally, JoanneJoanne Herrera counseled regarding the option of diagnostic testing viachorionic villus sampling (CVS) oramniocentesis. We discussed the technical aspects of each procedure and quoted up to a 1 in 500 (0.2%) risk for spontaneous pregnancy loss or other adverse pregnancy outcomes as a result of either procedure. Cultured cells from either a placental or amniotic fluid sample allow for the visualization of a fetal karyotype, which can detect >99% of large chromosomal aberrations, including trisomy 21. Chromosomal microarray can also be performed to identify smallerdeletions or duplications of fetal chromosomal material. After careful consideration, JoanneBeanedeclined diagnostic testing at this time. She understands that diagnostic testing is available at any point through the end of pregnancy and that she may opt to undergo the procedure at a later date should she change her mind. She also understands that diagnostic testing is the only way to definitively determine if the fetus has trisomy 21  prenatally.   Plan:  [name] felt reassured following learning of results from her ultrasound today. However, she indicated that she still may be interested in pursuing NIPS. She requested that I perform a benefits investigation to estimate the expected out of pocket cost for testing. We discussed that the laboratory [facility] offers NIPS for $99 out of pocket if pursuing testing through insurance is expected to cost more than that. I will contact [name] once the benefits investigation is complete and facilitate sample collection and testing from there if still desired.  I counseled [name] regarding the above risks and available options. The approximate face-to-face time with the genetic counselor was 30 minutes.  In summary:  Discussed first trimester screening results and options for follow-up testing  1 in 170 (0.6%) chance for current pregnancy to be affected by Down syndrome  Interested in NIPS for chromosomal aneuploidies. I will perform benefits investigation to estimate cost and facilitate testing from there  Reviewed results of ultrasound  No fetal anomalies or markers seen (limited by early gestational age)  Nuchal translucency measurement 2.8 mm (upper limit of normal)  Offered additional testing and screening  Declined chorionic villus sampling  Reviewed family history concerns   Gershon Crane, MS, Aeronautical engineer

## 2020-06-10 ENCOUNTER — Telehealth: Payer: Self-pay | Admitting: Genetic Counselor

## 2020-06-10 ENCOUNTER — Telehealth: Payer: Self-pay

## 2020-06-10 NOTE — Telephone Encounter (Addendum)
ADDENDUM 12/21: Ms. Drum called our scheduling line following our discussion on the 17th and indicated that after speaking with her husband, the couple felt reassured and no longer wished to pursue NIPS. Our scheduling staff has canceled her lab appointment accordingly. Ms. Mcenroe is welcome to contact us if she desires additional screening or testing at any point in the future.  ------------------------------------------------------------------------------------------------------------------------------  I called Ms. Dollard to discuss results from her benefits investigation for noninvasive prenatal screening (NIPS). Per a billing representative from the laboratory [facility], the expected out of pocket cost would be $450 if testing was ordered through her insurance. Ms. Larzelere elected to pursue testing via [facility]'s patient pay price of $99 instead. She informed me that she would like NIPS for trisomies 17, 15, and 13 in addition to sex chromosome aneuploidies but does not want to know the expected fetal sex. She would like to receive the expected fetal sex in an envelope.  Ms. Amsler will return to MFM to have her blood drawn on Monday, 10/20 at 8:30 am. Results take approximately one week to be returned; however, I informed her that results may be slightly delayed due to the upcoming holiday. Additionally, I will be out of the office from 12/23-12/28. If Ms. Alewine results are returned during that timeframe, I will have my genetic counseling colleague Katrina Stack call out Ms. Butikofer's result. I also informed her that fetal sex will be listed on the NIPS report, so she should not look at the report if she does not want to know this information. She confirmed that she had no further questions at this time.  Gershon Crane, MS, [facility] Genetic Counselor

## 2020-06-10 NOTE — Telephone Encounter (Signed)
Received a call from patient wanting to cancel her appointment for Monday 06/13/20 for NIPS to be drawn.  After she spoke with her husband they decided they got good results from the ultrasound, so they are going to decline NIPS

## 2020-06-25 NOTE — L&D Delivery Note (Addendum)
Delivery Note At 2:28 PM a viable female was delivered via Vaginal, Spontaneous (Presentation:   Occiput Anterior).  APGAR: pending, ; weight 2 lb 15.3 oz (1340 g).   Placenta status: Spontaneous, Intact.  Cord: 3 vessels with the following complications: None.  Cord pH: sent  Anesthesia: Epidural Episiotomy: None Lacerations: None Suture Repair: none Est. Blood Loss (mL): 200cc   Continue Mag x 24 hours pp at 1 g/hr given critical Calcium value.  It's a girl - "Joanne Herrera"!!   Mom to postpartum.  Baby to NICU.  Ranae Pila 10/15/2020, 3:04 PM

## 2020-09-21 ENCOUNTER — Encounter: Payer: 59 | Attending: Obstetrics & Gynecology | Admitting: Registered"

## 2020-09-21 ENCOUNTER — Other Ambulatory Visit: Payer: Self-pay

## 2020-09-21 ENCOUNTER — Encounter: Payer: Self-pay | Admitting: Registered"

## 2020-09-21 DIAGNOSIS — O24419 Gestational diabetes mellitus in pregnancy, unspecified control: Secondary | ICD-10-CM | POA: Diagnosis not present

## 2020-09-21 NOTE — Progress Notes (Signed)
Patient was seen on 09/21/20 for Gestational Diabetes self-management class at the [facility]. The following learning objectives were met by the patient during this course:   States the definition of Gestational Diabetes  States why dietary management is important in controlling blood glucose  Describes the effects each nutrient has on blood glucose levels  Demonstrates ability to create a balanced meal plan  Demonstrates carbohydrate counting   States when to check blood glucose levels  Demonstrates proper blood glucose monitoring techniques  States the effect of stress and exercise on blood glucose levels  States the importance of limiting caffeine and abstaining from alcohol and smoking  Blood glucose monitor given:OneTouch Verio Flex Lot# [id number] Z Exp: 03/24/2021 Blood Glucose: 87 mg/dL  Patient instructed to monitor glucose levels: FBS: 60 - <95; 1 hour: <140; 2 hour: <120  Patient received handouts:  Nutrition Diabetes and Pregnancy, including carb counting list  Patient will be seen for follow-up as needed.

## 2020-10-13 ENCOUNTER — Inpatient Hospital Stay (HOSPITAL_COMMUNITY)
Admission: AD | Admit: 2020-10-13 | Discharge: 2020-10-17 | DRG: 807 | Disposition: A | Discharge status: Home / Self Care | Payer: 59 | Attending: Obstetrics and Gynecology | Admitting: Obstetrics and Gynecology

## 2020-10-13 ENCOUNTER — Encounter (HOSPITAL_COMMUNITY): Payer: Self-pay | Admitting: Obstetrics and Gynecology

## 2020-10-13 ENCOUNTER — Other Ambulatory Visit: Payer: Self-pay

## 2020-10-13 DIAGNOSIS — O1414 Severe pre-eclampsia complicating childbirth: Principal | ICD-10-CM | POA: Diagnosis present

## 2020-10-13 DIAGNOSIS — R030 Elevated blood-pressure reading, without diagnosis of hypertension: Secondary | ICD-10-CM | POA: Diagnosis not present

## 2020-10-13 DIAGNOSIS — F419 Anxiety disorder, unspecified: Secondary | ICD-10-CM | POA: Diagnosis present

## 2020-10-13 DIAGNOSIS — O1490 Unspecified pre-eclampsia, unspecified trimester: Secondary | ICD-10-CM | POA: Diagnosis present

## 2020-10-13 DIAGNOSIS — Z20822 Contact with and (suspected) exposure to covid-19: Secondary | ICD-10-CM | POA: Diagnosis present

## 2020-10-13 DIAGNOSIS — O1493 Unspecified pre-eclampsia, third trimester: Secondary | ICD-10-CM

## 2020-10-13 DIAGNOSIS — O365930 Maternal care for other known or suspected poor fetal growth, third trimester, not applicable or unspecified: Secondary | ICD-10-CM | POA: Diagnosis present

## 2020-10-13 DIAGNOSIS — Z3A32 32 weeks gestation of pregnancy: Secondary | ICD-10-CM

## 2020-10-13 DIAGNOSIS — O99344 Other mental disorders complicating childbirth: Secondary | ICD-10-CM | POA: Diagnosis present

## 2020-10-13 DIAGNOSIS — O24420 Gestational diabetes mellitus in childbirth, diet controlled: Secondary | ICD-10-CM | POA: Diagnosis present

## 2020-10-13 DIAGNOSIS — O99284 Endocrine, nutritional and metabolic diseases complicating childbirth: Secondary | ICD-10-CM | POA: Diagnosis present

## 2020-10-13 LAB — URINALYSIS, ROUTINE W REFLEX MICROSCOPIC
Bilirubin Urine: NEGATIVE
Glucose, UA: NEGATIVE mg/dL
Hgb urine dipstick: NEGATIVE
Ketones, ur: NEGATIVE mg/dL
Nitrite: NEGATIVE
Protein, ur: >=300 mg/dL — AB
Specific Gravity, Urine: 1.025 (ref 1.005–1.030)
pH: 6 (ref 5.0–8.0)

## 2020-10-13 LAB — CBC
HCT: 35.4 % — ABNORMAL LOW (ref 36.0–46.0)
Hemoglobin: 11.8 g/dL — ABNORMAL LOW (ref 12.0–15.0)
MCH: 28.4 pg (ref 26.0–34.0)
MCHC: 33.3 g/dL (ref 30.0–36.0)
MCV: 85.3 fL (ref 80.0–100.0)
Platelets: 265 10*3/uL (ref 150–400)
RBC: 4.15 MIL/uL (ref 3.87–5.11)
RDW: 12.5 % (ref 11.5–15.5)
WBC: 10.3 10*3/uL (ref 4.0–10.5)
nRBC: 0 % (ref 0.0–0.2)

## 2020-10-13 LAB — TYPE AND SCREEN
ABO/RH(D): B POS
Antibody Screen: NEGATIVE

## 2020-10-13 LAB — COMPREHENSIVE METABOLIC PANEL
ALT: 14 U/L (ref 0–44)
AST: 22 U/L (ref 15–41)
Albumin: 2.8 g/dL — ABNORMAL LOW (ref 3.5–5.0)
Alkaline Phosphatase: 93 U/L (ref 38–126)
Anion gap: 6 (ref 5–15)
BUN: 11 mg/dL (ref 6–20)
CO2: 21 mmol/L — ABNORMAL LOW (ref 22–32)
Calcium: 8.5 mg/dL — ABNORMAL LOW (ref 8.9–10.3)
Chloride: 108 mmol/L (ref 98–111)
Creatinine, Ser: 0.75 mg/dL (ref 0.44–1.00)
GFR, Estimated: >60 mL/min (ref >60)
Glucose, Bld: 79 mg/dL (ref 70–99)
Potassium: 4.7 mmol/L (ref 3.5–5.1)
Sodium: 135 mmol/L (ref 135–145)
Total Bilirubin: 0.7 mg/dL (ref 0.3–1.2)
Total Protein: 6.7 g/dL (ref 6.5–8.1)

## 2020-10-13 LAB — PROTEIN / CREATININE RATIO, URINE
Creatinine, Urine: 298.66 mg/dL
Protein Creatinine Ratio: 0.84 mg/mg{Cre} — ABNORMAL HIGH (ref 0.00–0.15)
Total Protein, Urine: 252 mg/dL

## 2020-10-13 LAB — RESP PANEL BY RT-PCR (FLU A&B, COVID) ARPGX2
Influenza A by PCR: NEGATIVE
Influenza B by PCR: NEGATIVE
SARS Coronavirus 2 by RT PCR: NEGATIVE

## 2020-10-13 MED ORDER — ONDANSETRON 4 MG PO TBDP
4.0000 mg | ORAL_TABLET | Freq: Two times a day (BID) | ORAL | Status: DC | PRN
  Administered 2020-10-13 – 2020-10-15 (×2): 4 mg via ORAL
  Filled 2020-10-13 (×2): qty 1

## 2020-10-13 MED ORDER — ZOLPIDEM TARTRATE 5 MG PO TABS: 5.0000 mg | ORAL_TABLET | Freq: Every evening | ORAL | Status: DC | PRN

## 2020-10-13 MED ORDER — LABETALOL HCL 5 MG/ML IV SOLN: 80.0000 mg | INTRAVENOUS | Status: DC | PRN

## 2020-10-13 MED ORDER — ACETAMINOPHEN 500 MG PO TABS
1000.0000 mg | ORAL_TABLET | Freq: Once | ORAL | Status: AC
  Administered 2020-10-13: 1000 mg via ORAL
  Filled 2020-10-13: qty 2

## 2020-10-13 MED ORDER — LABETALOL HCL 5 MG/ML IV SOLN: 40.0000 mg | INTRAVENOUS | Status: DC | PRN

## 2020-10-13 MED ORDER — CALCIUM CARBONATE ANTACID 500 MG PO CHEW: 2.0000 | CHEWABLE_TABLET | ORAL | Status: DC | PRN

## 2020-10-13 MED ORDER — LABETALOL HCL 100 MG PO TABS: 100.0000 mg | ORAL_TABLET | Freq: Two times a day (BID) | ORAL | Status: DC

## 2020-10-13 MED ORDER — LABETALOL HCL 5 MG/ML IV SOLN: 20.0000 mg | INTRAVENOUS | Status: DC | PRN

## 2020-10-13 MED ORDER — ACETAMINOPHEN 325 MG PO TABS
650.0000 mg | ORAL_TABLET | ORAL | Status: DC | PRN
  Administered 2020-10-13: 650 mg via ORAL
  Filled 2020-10-13: qty 2

## 2020-10-13 MED ORDER — LABETALOL HCL 200 MG PO TABS
300.0000 mg | ORAL_TABLET | Freq: Three times a day (TID) | ORAL | Status: DC
  Administered 2020-10-13 – 2020-10-15 (×6): 300 mg via ORAL
  Filled 2020-10-13 (×6): qty 1

## 2020-10-13 MED ORDER — BUTALBITAL-APAP-CAFFEINE 50-325-40 MG PO TABS
2.0000 | ORAL_TABLET | Freq: Once | ORAL | Status: AC
  Administered 2020-10-13: 2 via ORAL
  Filled 2020-10-13: qty 2

## 2020-10-13 MED ORDER — PRENATAL MULTIVITAMIN CH
1.0000 | ORAL_TABLET | Freq: Every day | ORAL | Status: DC
  Administered 2020-10-13 – 2020-10-14 (×2): 1 via ORAL
  Filled 2020-10-13 (×2): qty 1

## 2020-10-13 MED ORDER — MAGNESIUM SULFATE BOLUS VIA INFUSION
4.0000 g | Freq: Once | INTRAVENOUS | Status: AC
  Administered 2020-10-13: 4 g via INTRAVENOUS
  Filled 2020-10-13: qty 1000

## 2020-10-13 MED ORDER — DOCUSATE SODIUM 100 MG PO CAPS
100.0000 mg | ORAL_CAPSULE | Freq: Every day | ORAL | Status: DC
  Administered 2020-10-14: 100 mg via ORAL
  Filled 2020-10-13: qty 1

## 2020-10-13 MED ORDER — BETAMETHASONE SOD PHOS & ACET 6 (3-3) MG/ML IJ SUSP
12.0000 mg | INTRAMUSCULAR | Status: AC
  Administered 2020-10-13 – 2020-10-14 (×2): 12 mg via INTRAMUSCULAR
  Filled 2020-10-13: qty 5

## 2020-10-13 MED ORDER — LACTATED RINGERS IV SOLN: INTRAVENOUS | Status: DC

## 2020-10-13 MED ORDER — MAGNESIUM SULFATE 40 GM/1000ML IV SOLN
1.0000 g/h | INTRAVENOUS | Status: DC
  Administered 2020-10-13 – 2020-10-14 (×2): 2 g/h via INTRAVENOUS
  Administered 2020-10-15: 1 g/h via INTRAVENOUS
  Filled 2020-10-13 (×3): qty 1000

## 2020-10-13 MED ORDER — HYDRALAZINE HCL 20 MG/ML IJ SOLN: 10.0000 mg | INTRAMUSCULAR | Status: DC | PRN

## 2020-10-13 NOTE — Progress Notes (Signed)
C/O HA now a 7/10. This is more sever than before. She had Tylenol about 1 hour ago with little relief. No vision change, no SOB, no CP.  Today's Vitals   10/13/20 1416 10/13/20 1500 10/13/20 1831 10/13/20 1836  BP: 131/84 (!) 155/108  (!) 141/93  Pulse: 66 74  80  Resp:      Temp:      TempSrc:      SpO2:      Weight:      Height:      PainSc:  4  7     Body mass index is 32.04 kg/m.   DTR 3+ without clonus  A/P: D/W patient- BP stable on labetalol 300mg  TID. Will start magnesium sulfate infusion, continuous FM, labs in am. Continue to follow closely.

## 2020-10-13 NOTE — H&P (Signed)
Joanne Herrera is a 25 y.o. female presenting for elevated BP. Today in office had BP of about 160/100s and 3+ proteinuria. She has a frontal HA on/off for several weeks. Today same HA with no change in character or severity. No vision changes, no epigastric pain. Evaluated in MAU where BPs were elevated but remained under severe level. Admitted to antenatal floor for surveillance and BP treatment. Pregnancy also complicated by Q3ESP with good control. Also had elevated risk of T-21 on first trimester screen, 1/170,  with a NT of 2.8 mm. MFM consult confirmed NT. Patient declined further testing or genetic counseling. OB History    Gravida  2   Para  1   Term  1   Preterm      AB      Living  1     SAB      IAB      Ectopic      Multiple  0   Live Births  1          Past Medical History:  Diagnosis Date  . Abdominal pain, acute, right lower quadrant 02/11/2012    Possible Appendix perforation, contained perforation, typhlitis, or Crohn's dz.    . Anxiety   . Bipolar depression (Trainer)   . Depression   . IBS (irritable bowel syndrome)    Past Surgical History:  Procedure Laterality Date  . APPENDECTOMY    . COLONOSCOPY    . LAPAROSCOPY  06/02/2012   Procedure: LAPAROSCOPY DIAGNOSTIC;  Surgeon: Gwenyth Ober, MD;  Location: [place];  Service: General;  Laterality: N/A;  . LAPAROTOMY  06/02/2012   Procedure: EXPLORATORY LAPAROTOMY;  Surgeon: Gwenyth Ober, MD;  Location: [place];  Service: General;  Laterality: N/A;  . LASIK    . PARTIAL COLECTOMY  06/02/2012   Procedure: PARTIAL COLECTOMY;  Surgeon: Gwenyth Ober, MD;  Location: [place];  Service: General;  Laterality: Right;   Family History: She was adopted. Family history is unknown by patient. Social History:  reports that she has never smoked. She has never used smokeless tobacco. She reports that she does not drink alcohol and does not use drugs.     Maternal Diabetes: Yes:  Diabetes Type:  Diet controlled Genetic  Screening: Abnormal:  Results: Elevated risk of Trisomy 21 Maternal Ultrasounds/Referrals: Normal Fetal Ultrasounds or other Referrals:  Referred to Materal Fetal Medicine  Maternal Substance Abuse:  No Significant Maternal Medications:  None Significant Maternal Lab Results:  None Other Comments:  None  Review of Systems  Constitutional: Negative for fever.  Eyes: Negative for visual disturbance.  Gastrointestinal: Negative for abdominal pain.  Neurological: Positive for headaches.       Mild frontal HA she has noted for several weeks. No change today in character or severity of HA   Maternal Medical History:  Fetal activity: Perceived fetal activity is normal.        Blood pressure (!) 155/108, pulse 74, temperature 98.5 F (36.9 C), temperature source Oral, resp. rate 16, height $RemoveBe'5\' 2"'vGHdvuWDq$  (1.575 m), weight 79.5 kg, last menstrual period 03/03/2020, SpO2 99 %, unknown if currently breastfeeding.   Fetal Exam Fetal State Assessment: Category I - tracings are normal.     Physical Exam Cardiovascular:     Heart sounds: Normal heart sounds.  Pulmonary:     Effort: Pulmonary effort is normal.     Prenatal labs: ABO, Rh: --/--/B POS (04/21 1148) Antibody: NEG (04/21 1148) Rubella:   RPR:  HBsAg:    HIV:    GBS:     Results for orders placed or performed during the hospital encounter of 10/13/20 (from the past 24 hour(s))  Protein / creatinine ratio, urine     Status: Abnormal   Collection Time: 10/13/20 11:38 AM  Result Value Ref Range   Creatinine, Urine 298.66 mg/dL   Total Protein, Urine 252 mg/dL   Protein Creatinine Ratio 0.84 (H) 0.00 - 0.15 mg/mg[Cre]  CBC     Status: Abnormal   Collection Time: 10/13/20 11:48 AM  Result Value Ref Range   WBC 10.3 4.0 - 10.5 K/uL   RBC 4.15 3.87 - 5.11 MIL/uL   Hemoglobin 11.8 (L) 12.0 - 15.0 g/dL   HCT 35.4 (L) 36.0 - 46.0 %   MCV 85.3 80.0 - 100.0 fL   MCH 28.4 26.0 - 34.0 pg   MCHC 33.3 30.0 - 36.0 g/dL   RDW 12.5  11.5 - 15.5 %   Platelets 265 150 - 400 K/uL   nRBC 0.0 0.0 - 0.2 %  Comprehensive metabolic panel     Status: Abnormal   Collection Time: 10/13/20 11:48 AM  Result Value Ref Range   Sodium 135 135 - 145 mmol/L   Potassium 4.7 3.5 - 5.1 mmol/L   Chloride 108 98 - 111 mmol/L   CO2 21 (L) 22 - 32 mmol/L   Glucose, Bld 79 70 - 99 mg/dL   BUN 11 6 - 20 mg/dL   Creatinine, Ser 0.75 0.44 - 1.00 mg/dL   Calcium 8.5 (L) 8.9 - 10.3 mg/dL   Total Protein 6.7 6.5 - 8.1 g/dL   Albumin 2.8 (L) 3.5 - 5.0 g/dL   AST 22 15 - 41 U/L   ALT 14 0 - 44 U/L   Alkaline Phosphatase 93 38 - 126 U/L   Total Bilirubin 0.7 0.3 - 1.2 mg/dL   GFR, Estimated >60 >60 mL/min   Anion gap 6 5 - 15  Type and screen [place]     Status: None   Collection Time: 10/13/20 11:48 AM  Result Value Ref Range   ABO/RH(D) B POS    Antibody Screen NEG    Sample Expiration      10/16/2020,2359 Performed at [facility], [address] [place],  [address]   Urinalysis, Routine w reflex microscopic Urine, Clean Catch     Status: Abnormal   Collection Time: 10/13/20 11:51 AM  Result Value Ref Range   Color, Urine YELLOW YELLOW   APPearance HAZY (A) CLEAR   Specific Gravity, Urine 1.025 1.005 - 1.030   pH 6.0 5.0 - 8.0   Glucose, UA NEGATIVE NEGATIVE mg/dL   Hgb urine dipstick NEGATIVE NEGATIVE   Bilirubin Urine NEGATIVE NEGATIVE   Ketones, ur NEGATIVE NEGATIVE mg/dL   Protein, ur >=300 (A) NEGATIVE mg/dL   Nitrite NEGATIVE NEGATIVE   Leukocytes,Ua SMALL (A) NEGATIVE   RBC / HPF 0-5 0 - 5 RBC/hpf   WBC, UA 6-10 0 - 5 WBC/hpf   Bacteria, UA RARE (A) NONE SEEN   Squamous Epithelial / LPF 11-20 0 - 5   Mucus PRESENT    Assessment/Plan: 25 yo G2P1 @ 32 0/7 weeks Preeclampsia - has not met criteria for severe at this point although BPs are borderline                        -labetalol $RemoveBef'300mg'ofegGQEjNB$  po TID                        -  repeat labs in am                        -if meets criteria for  severe will start magnesium sulfate infusion   A1GDM - carb controlled diet              - FBS in am  FWB- NST q shift         - MFM U/S ordered         -BMZ #1 done, #2 tomorrow  Consult with Dr Christella Scheuermann D/W patient above.                           Shon Millet II 10/13/2020, 4:29 PM

## 2020-10-13 NOTE — MAU Note (Signed)
Joanne Herrera is a 25 y.o. at [redacted]w[redacted]d here in MAU reporting: was sent over from the office for HTN, states BP was 160/106. Currently has a mild headache. Denies visual changes and RUQ pain. No contractions, bleeding, or LOF. +FM  Onset of complaint: today  Pain score: 4/10  Vitals:   10/13/20 1121  BP: (!) 157/104  Pulse: 94  Resp: 16  Temp: 98.5 F (36.9 C)  SpO2: 100%     FHT: EFM applied in room  Lab orders placed from triage: UA

## 2020-10-13 NOTE — MAU Provider Note (Signed)
History     CSN: [id number]  Arrival date and time: 10/13/20 1047   Event Date/Time   First Provider Initiated Contact with Patient 10/13/20 1144      Chief Complaint  Patient presents with  . Hypertension  . Headache   HPI Joanne Herrera is a 25 y.o. G2P1001 at [redacted]w[redacted]d who presents for hypertension evaluation. Was in office today for ob visit & had severe range blood pressures. Denies any history of hypertension. Reports frontal headache that she rates 4/10. Hasn't treated headache. Nothing makes better or worse. Denies visual disturbance or epigastric pain. Denies contractions, LOF, or vaginal bleeding. Reports good fetal movement.   OB History    Gravida  2   Para  1   Term  1   Preterm      AB      Living  1     SAB      IAB      Ectopic      Multiple  0   Live Births  1           Past Medical History:  Diagnosis Date  . Abdominal pain, acute, right lower quadrant 02/11/2012    Possible Appendix perforation, contained perforation, typhlitis, or Crohn's dz.    . Anxiety   . Bipolar depression (HCC)   . Depression   . IBS (irritable bowel syndrome)     Past Surgical History:  Procedure Laterality Date  . APPENDECTOMY    . COLONOSCOPY    . LAPAROSCOPY  06/02/2012   Procedure: LAPAROSCOPY DIAGNOSTIC;  Surgeon: Cherylynn Ridges, MD;  Location: [facility];  Service: General;  Laterality: N/A;  . LAPAROTOMY  06/02/2012   Procedure: EXPLORATORY LAPAROTOMY;  Surgeon: Cherylynn Ridges, MD;  Location: [facility] OR;  Service: General;  Laterality: N/A;  . LASIK    . PARTIAL COLECTOMY  06/02/2012   Procedure: PARTIAL COLECTOMY;  Surgeon: Cherylynn Ridges, MD;  Location: [facility] OR;  Service: General;  Laterality: Right;    Family History  Adopted: Yes  Family history unknown: Yes    Social History   Tobacco Use  . Smoking status: Never Smoker  . Smokeless tobacco: Never Used  Vaping Use  . Vaping Use: Never used  Substance Use Topics  . Alcohol use: No  . Drug use: No     Allergies: No Known Allergies  Medications Prior to Admission  Medication Sig Dispense Refill Last Dose  . acetaminophen (TYLENOL) 325 MG tablet Take 2 tablets (650 mg total) by mouth every 6 (six) hours as needed (or Temp > 100).     . cetirizine HCl (ZYRTEC) 1 MG/ML solution Take 10 mg by mouth daily. (Patient not taking: Reported on 06/09/2020)     . ondansetron (ZOFRAN-ODT) 4 MG disintegrating tablet Take 4 mg by mouth every 8 (eight) hours as needed for nausea or vomiting. (Patient not taking: Reported on 06/09/2020)     . Prenatal Vit-Fe Fumarate-FA (MULTIVITAMIN-PRENATAL) 27-0.8 MG TABS tablet Take 1 tablet by mouth daily at 12 noon. (Patient not taking: Reported on 06/09/2020)     . promethazine (PHENERGAN) 12.5 MG tablet Take 12.5 mg by mouth every 6 (six) hours as needed for nausea or vomiting.     . sertraline (ZOLOFT) 25 MG tablet Take 25 mg by mouth daily.        Review of Systems  Constitutional: Negative.   Eyes: Negative for photophobia and visual disturbance.  Gastrointestinal: Negative.   Genitourinary: Negative.  Neurological: Positive for headaches.   Physical Exam   Blood pressure (!) 152/106, pulse 85, temperature 98.5 F (36.9 C), temperature source Oral, resp. rate 16, height 5\' 2"  (1.575 m), weight 79.5 kg, last menstrual period 03/03/2020, SpO2 99 %, unknown if currently breastfeeding.  Patient Vitals for the past 24 hrs:  BP Temp Temp src Pulse Resp SpO2 Height Weight  10/13/20 1401 126/83 -- -- 65 -- -- -- --  10/13/20 1346 130/81 -- -- 61 -- -- -- --  10/13/20 1331 (!) 144/98 -- -- 65 -- -- -- --  10/13/20 1316 (!) 143/101 -- -- 69 -- -- -- --  10/13/20 1301 (!) 141/100 -- -- 77 -- -- -- --  10/13/20 1246 (!) 146/102 -- -- 66 -- -- -- --  10/13/20 1231 (!) 141/98 -- -- 78 -- -- -- --  10/13/20 1230 -- -- -- -- -- 100 % -- --  10/13/20 1220 -- -- -- -- -- 100 % -- --  10/13/20 1216 (!) 145/100 -- -- 70 -- -- -- --  10/13/20 1201 (!) 149/99 -- --  91 -- -- -- --  10/13/20 1147 (!) 145/105 -- -- 97 -- 100 % -- --  10/13/20 1130 (!) 152/106 -- -- 85 -- 99 % -- --  10/13/20 1121 (!) 157/104 98.5 F (36.9 C) Oral 94 16 100 % 5\' 2"  (1.575 m) 79.5 kg    Physical Exam Vitals and nursing note reviewed.  Constitutional:      General: She is not in acute distress.    Appearance: She is well-developed.  HENT:     Head: Normocephalic and atraumatic.  Cardiovascular:     Rate and Rhythm: Normal rate and regular rhythm.     Heart sounds: Normal heart sounds.  Pulmonary:     Effort: Pulmonary effort is normal. No respiratory distress.     Breath sounds: Normal breath sounds.  Musculoskeletal:     Right lower leg: 2+ Edema present.     Left lower leg: 2+ Edema present.  Skin:    General: Skin is warm and dry.  Neurological:     Mental Status: She is alert.     Deep Tendon Reflexes: Reflexes normal.  Psychiatric:        Mood and Affect: Mood normal.        Behavior: Behavior normal.    NST:  Baseline: 145 bpm, Variability: Good {> 6 bpm), Accelerations: Reactive and Decelerations: Absent  MAU Course  Procedures Results for orders placed or performed during the hospital encounter of 10/13/20 (from the past 24 hour(s))  Protein / creatinine ratio, urine     Status: Abnormal   Collection Time: 10/13/20 11:38 AM  Result Value Ref Range   Creatinine, Urine 298.66 mg/dL   Total Protein, Urine 252 mg/dL   Protein Creatinine Ratio 0.84 (H) 0.00 - 0.15 mg/mg[Cre]  CBC     Status: Abnormal   Collection Time: 10/13/20 11:48 AM  Result Value Ref Range   WBC 10.3 4.0 - 10.5 K/uL   RBC 4.15 3.87 - 5.11 MIL/uL   Hemoglobin 11.8 (L) 12.0 - 15.0 g/dL   HCT 10/15/20 (L) 10/15/20 - 87.6 %   MCV 85.3 80.0 - 100.0 fL   MCH 28.4 26.0 - 34.0 pg   MCHC 33.3 30.0 - 36.0 g/dL   RDW 81.1 57.2 - 62.0 %   Platelets 265 150 - 400 K/uL   nRBC 0.0 0.0 - 0.2 %  Comprehensive metabolic panel  Status: Abnormal   Collection Time: 10/13/20 11:48 AM  Result  Value Ref Range   Sodium 135 135 - 145 mmol/L   Potassium 4.7 3.5 - 5.1 mmol/L   Chloride 108 98 - 111 mmol/L   CO2 21 (L) 22 - 32 mmol/L   Glucose, Bld 79 70 - 99 mg/dL   BUN 11 6 - 20 mg/dL   Creatinine, Ser 4.58 0.44 - 1.00 mg/dL   Calcium 8.5 (L) 8.9 - 10.3 mg/dL   Total Protein 6.7 6.5 - 8.1 g/dL   Albumin 2.8 (L) 3.5 - 5.0 g/dL   AST 22 15 - 41 U/L   ALT 14 0 - 44 U/L   Alkaline Phosphatase 93 38 - 126 U/L   Total Bilirubin 0.7 0.3 - 1.2 mg/dL   GFR, Estimated >09 >98 mL/min   Anion gap 6 5 - 15  Urinalysis, Routine w reflex microscopic Urine, Clean Catch     Status: Abnormal   Collection Time: 10/13/20 11:51 AM  Result Value Ref Range   Color, Urine YELLOW YELLOW   APPearance HAZY (A) CLEAR   Specific Gravity, Urine 1.025 1.005 - 1.030   pH 6.0 5.0 - 8.0   Glucose, UA NEGATIVE NEGATIVE mg/dL   Hgb urine dipstick NEGATIVE NEGATIVE   Bilirubin Urine NEGATIVE NEGATIVE   Ketones, ur NEGATIVE NEGATIVE mg/dL   Protein, ur >=338 (A) NEGATIVE mg/dL   Nitrite NEGATIVE NEGATIVE   Leukocytes,Ua SMALL (A) NEGATIVE   RBC / HPF 0-5 0 - 5 RBC/hpf   WBC, UA 6-10 0 - 5 WBC/hpf   Bacteria, UA RARE (A) NONE SEEN   Squamous Epithelial / LPF 11-20 0 - 5   Mucus PRESENT     MDM Patient presents with new onset hypertension. BPs remain elevated while in MAU, not severe range. Preeclampsia labs collected - bloodwork normal, protein creatinine ratio elevated giving diagnosis of preeclampsia.   Patient given 1 gm tylenol for 4/10 headache. Reports no relief in headache while in MAU.   Dr. Henderson Cloud notified of patient's exam, labs, BPs - will admit to obsc for observation   Assessment and Plan   1. Pre-eclampsia in third trimester   2. [redacted] weeks gestation of pregnancy   -Dr. Henderson Cloud will admit to obsc unit   Judeth Horn 10/13/2020, 11:44 AM

## 2020-10-14 ENCOUNTER — Observation Stay (HOSPITAL_BASED_OUTPATIENT_CLINIC_OR_DEPARTMENT_OTHER): Payer: 59

## 2020-10-14 DIAGNOSIS — O289 Unspecified abnormal findings on antenatal screening of mother: Secondary | ICD-10-CM

## 2020-10-14 DIAGNOSIS — O365930 Maternal care for other known or suspected poor fetal growth, third trimester, not applicable or unspecified: Secondary | ICD-10-CM

## 2020-10-14 DIAGNOSIS — Z3A32 32 weeks gestation of pregnancy: Secondary | ICD-10-CM

## 2020-10-14 DIAGNOSIS — O1490 Unspecified pre-eclampsia, unspecified trimester: Secondary | ICD-10-CM | POA: Diagnosis present

## 2020-10-14 DIAGNOSIS — O1493 Unspecified pre-eclampsia, third trimester: Secondary | ICD-10-CM

## 2020-10-14 DIAGNOSIS — Z369 Encounter for antenatal screening, unspecified: Secondary | ICD-10-CM | POA: Diagnosis not present

## 2020-10-14 DIAGNOSIS — Z20822 Contact with and (suspected) exposure to covid-19: Secondary | ICD-10-CM | POA: Diagnosis present

## 2020-10-14 DIAGNOSIS — F419 Anxiety disorder, unspecified: Secondary | ICD-10-CM | POA: Diagnosis present

## 2020-10-14 DIAGNOSIS — O1413 Severe pre-eclampsia, third trimester: Secondary | ICD-10-CM

## 2020-10-14 DIAGNOSIS — O24410 Gestational diabetes mellitus in pregnancy, diet controlled: Secondary | ICD-10-CM

## 2020-10-14 DIAGNOSIS — O99284 Endocrine, nutritional and metabolic diseases complicating childbirth: Secondary | ICD-10-CM | POA: Diagnosis present

## 2020-10-14 DIAGNOSIS — R030 Elevated blood-pressure reading, without diagnosis of hypertension: Secondary | ICD-10-CM | POA: Diagnosis present

## 2020-10-14 DIAGNOSIS — O1414 Severe pre-eclampsia complicating childbirth: Secondary | ICD-10-CM | POA: Diagnosis present

## 2020-10-14 DIAGNOSIS — O99344 Other mental disorders complicating childbirth: Secondary | ICD-10-CM | POA: Diagnosis present

## 2020-10-14 DIAGNOSIS — O24420 Gestational diabetes mellitus in childbirth, diet controlled: Secondary | ICD-10-CM | POA: Diagnosis present

## 2020-10-14 LAB — CBC
HCT: 34.8 % — ABNORMAL LOW (ref 36.0–46.0)
Hemoglobin: 11.5 g/dL — ABNORMAL LOW (ref 12.0–15.0)
MCH: 28.3 pg (ref 26.0–34.0)
MCHC: 33 g/dL (ref 30.0–36.0)
MCV: 85.7 fL (ref 80.0–100.0)
Platelets: 262 10*3/uL (ref 150–400)
RBC: 4.06 MIL/uL (ref 3.87–5.11)
RDW: 12.8 % (ref 11.5–15.5)
WBC: 12.3 10*3/uL — ABNORMAL HIGH (ref 4.0–10.5)
nRBC: 0 % (ref 0.0–0.2)

## 2020-10-14 LAB — COMPREHENSIVE METABOLIC PANEL
ALT: 15 U/L (ref 0–44)
AST: 22 U/L (ref 15–41)
Albumin: 2.7 g/dL — ABNORMAL LOW (ref 3.5–5.0)
Alkaline Phosphatase: 94 U/L (ref 38–126)
Anion gap: 9 (ref 5–15)
BUN: 8 mg/dL (ref 6–20)
CO2: 17 mmol/L — ABNORMAL LOW (ref 22–32)
Calcium: 7.7 mg/dL — ABNORMAL LOW (ref 8.9–10.3)
Chloride: 107 mmol/L (ref 98–111)
Creatinine, Ser: 0.72 mg/dL (ref 0.44–1.00)
GFR, Estimated: >60 mL/min (ref >60)
Glucose, Bld: 124 mg/dL — ABNORMAL HIGH (ref 70–99)
Potassium: 4.4 mmol/L (ref 3.5–5.1)
Sodium: 133 mmol/L — ABNORMAL LOW (ref 135–145)
Total Bilirubin: 0.4 mg/dL (ref 0.3–1.2)
Total Protein: 6.1 g/dL — ABNORMAL LOW (ref 6.5–8.1)

## 2020-10-14 LAB — MAGNESIUM: Magnesium: 5.7 mg/dL — ABNORMAL HIGH (ref 1.7–2.4)

## 2020-10-14 LAB — GLUCOSE, CAPILLARY
Glucose-Capillary: 118 mg/dL — ABNORMAL HIGH (ref 70–99)
Glucose-Capillary: 124 mg/dL — ABNORMAL HIGH (ref 70–99)

## 2020-10-14 MED ORDER — CETIRIZINE HCL 5 MG/5ML PO SOLN
10.0000 mg | Freq: Every day | ORAL | Status: DC | PRN
  Filled 2020-10-14: qty 10

## 2020-10-14 MED ORDER — DIPHENHYDRAMINE HCL 50 MG/ML IJ SOLN
25.0000 mg | Freq: Once | INTRAMUSCULAR | Status: AC
  Administered 2020-10-14: 25 mg via INTRAVENOUS
  Filled 2020-10-14: qty 1

## 2020-10-14 MED ORDER — OXYCODONE HCL 5 MG PO TABS: 10.0000 mg | ORAL_TABLET | Freq: Four times a day (QID) | ORAL | Status: DC | PRN

## 2020-10-14 MED ORDER — METOCLOPRAMIDE HCL 5 MG/ML IJ SOLN
10.0000 mg | Freq: Once | INTRAMUSCULAR | Status: AC
  Administered 2020-10-14: 10 mg via INTRAVENOUS
  Filled 2020-10-14: qty 2

## 2020-10-14 MED ORDER — ACETAMINOPHEN 500 MG PO TABS
1000.0000 mg | ORAL_TABLET | Freq: Four times a day (QID) | ORAL | Status: DC | PRN
  Administered 2020-10-14 – 2020-10-15 (×3): 1000 mg via ORAL
  Filled 2020-10-14 (×3): qty 2

## 2020-10-14 MED ORDER — SERTRALINE HCL 50 MG PO TABS
25.0000 mg | ORAL_TABLET | Freq: Every day | ORAL | Status: DC
  Administered 2020-10-14 – 2020-10-16 (×3): 25 mg via ORAL
  Filled 2020-10-14 (×4): qty 1

## 2020-10-14 MED ORDER — CYCLOBENZAPRINE HCL 10 MG PO TABS
5.0000 mg | ORAL_TABLET | Freq: Three times a day (TID) | ORAL | Status: DC | PRN
  Administered 2020-10-14: 5 mg via ORAL
  Filled 2020-10-14: qty 1

## 2020-10-14 NOTE — Consult Note (Signed)
MFM Consult  Joanne Herrera is a 25 year old gravida 2 para 1 currently at 32 weeks and 1 day.  She was seen in consultation due to severe preeclampsia.  The patient was admitted last evening after elevated blood pressures of 160/100 along with 3+ proteinuria was noted.  The patient reports that she has experienced on and off headaches for the past week or so.  She continues to have a headache today.    Her current pregnancy has also been complicated by diet-controlled gestational diabetes which has been under good control. On her first trimester screening test, an elevated Down syndrome risk of 1 in 170 was noted.  The nuchal translucency measurement was 2.8 mm.  The patient did not have a cell free DNA test drawn following this elevated screening test.  On admission, her blood pressures were noted to be in the 130s to 140s over 80s to 90s range.  The patient was started on labetalol 300 mg 3 times a day for blood pressure control.  She is also being given a complete course of antenatal corticosteroids.   Her PIH labs on admission were all within normal limits. Her P/C ratio was elevated (0.84).   Due to her persistent headaches and her 3+ deep tendon reflexes, she was started on magnesium sulfate for maternal seizure prophylaxis.  Her prior obstetrical history includes a term normal spontaneous vaginal delivery of an infant weighing 5 pounds 10 ounces.  She denies any problems in that pregnancy.  Her past medical history includes bipolar disorder and depression.  She denies any history of hypertension.  Her past surgical history includes an appendectomy, laparoscopy, laparotomy with a partial colectomy for an inflamed bowel of undetermined reason.  The patient had an ultrasound performed this morning showing an EFW of 3 pounds 4 ounces (2nd percentile for her gestational age) indicating fetal growth restriction.    There was normal amniotic fluid noted with a total AFI of 13.4 cm.  The fetus is  in the vertex presentation.  The views of the fetal anatomy were limited today due to her advanced gestational age.    Doppler studies of the umbilical arteries performed due to fetal growth restriction showed a normal S/D ratio.  There were no signs of absent or reversed end-diastolic flow noted.  A biophysical profile performed today was 8 out of 8.  The implications and management of preeclampsia was discussed with the patient and her husband.  She was advised that preeclampsia can affect both the mother and the fetus.  In the mother, preeclampsia may cause a rise in blood pressures and it can affect the mother's kidney, liver and platelet functions.  It may also cause the mother to have seizures.  In the fetus, it may cause growth restriction and oligohydramnios.  She understands that delivery is the only treatment for preeclampsia.  Due to severe preeclampsia with fetal growth restriction, I would recommend inpatient management until delivery.  She may continue the antihypertensive medications (labetalol) for blood pressure control so that she may reach a more optimal gestational age for delivery.    Due to her persistent headache and brisk reflexes, I would recommend that she be continued on magnesium sulfate for maternal seizure prophylaxis until her antenatal corticosteroid course is completed.  The goal for her delivery would be 34 weeks.    Based on her persistent headache and brisk reflexes, I anticipate that she will most likely require delivery prior to that time.    Should she require delivery  prior to delivery at 34 weeks and it has been 7 days or greater since she received the initial course of antenatal corticosteroids, a rescue course of steroids is recommended.    Delivery prior to 34 weeks would be recommended if her blood pressures remain persistently greater than 160/100 despite medication treatment, should she require further IV push antihypertensive medication for blood  pressure control, should her headaches persist once she completes her steroid course, should her PIH labs start to show abnormalities, or should there be nonreassuring fetal status.  The patient understands that many women may continue to experience elevated blood pressures for up to 6 weeks postpartum and that antihypertensive medications may need to be continued after delivery.  At the end of the consultation, the patient stated that all of her questions had been answered to her complete satisfaction.   Thank you for referring this patient for a Maternal-Fetal Medicine consultation.  Recommendations: -Inpatient management until delivery -Continue magnesium sulfate for maternal seizure prophylaxis until her antenatal corticosteroid course is completed -Continue labetalol for blood pressure control to enable her to reach a more optimal gestational age -Repeat PIH labs twice a week while hospitalized -Weekly biophysical profiles, umbilical artery Doppler studies, and amniotic fluid checks -Rescue course of steroids should she require delivery before 34 weeks and it has been 7 days or more since her initial course -Delivery at 34 weeks -Delivery prior to 34 weeks would be indicated:   Should her blood pressures be persistently greater than 160/100 despite treatment  Should she require IV push medication for blood pressure control  Should her headaches persist after she completes her steroid course  Should her PIH labs show any abnormalities  At any time for nonreassuring fetal status

## 2020-10-14 NOTE — Progress Notes (Signed)
After meds wore off, HA went back to a 7. However, declines more meds at this time as she overall feels well per her nurse. Able to do activities and feels fine.  Before bed would like meds to help.  Will do Benadyrl + reglan combo before bed. Will continue to readdress plan.

## 2020-10-14 NOTE — Progress Notes (Addendum)
S: Doing well overall.  HA yesterday got worse after tylenol and then was able to sleep with fioricet. Of note, this HA has been present for weeks on and off and pre-dates elevated Bps.This AM HA still present - 7/10. Not worse. Rubbing the muscles makes it better. Reports "heavy feeling" behind her eyes and feeling sleepy on Magnesium.  She denies change in vision, SOB, CP, and worsening swelling.  Husband Apolinar Junes doing fine. Dawson (SVD at 37.6 wga 5#10) is doing well at home. They are expecting a girl - "Chloe".   O:  Vitals:   10/14/20 0700 10/14/20 0742  BP:  125/80  Pulse:  90  Resp: 16 17  Temp:  98.2 F (36.8 C)  SpO2:  98%   NAD, A&O NWOB Abd soft, nondistended, gravid Reflexes normal (on Dr. Loretta Plume exam this AM brisk), Cns 2-12 grossly intact  A/P: 25yo G2P1001 admitted at 32.0 wga with severe PIH currently 32.1 wga.   # Pre-eclampsia with severe features: HA as severe feature. - Tylenol 1000mg  prn mild pain. Add flexeril 5mg  prn moderate pain and oxycodone prn severe pain. Can also have dexamethasone/reglan/benadryl combo or fioricet.  - NO other s/s severe disease - Bps normal to mild range, ctm BPs - PIH labs WNL - IV anti-hypertensives prn persistent severe range Bps - Cont Labetalol 300mg  TID, increase prn.  - Mag IV until BMZ complete - Mag when IOL @ 34wga or sooner prn worsening disease - s/p MFM consult and counseled regarding the risks of expectant managment  # IUGR: - EFW 3#4, 2%ile, AFI WNL - UAD WNL  # MWB: - anxiety: zoloft 25mg . - A1GDM: FBS daily. 118 this AM. CTM for trend. If persistently > 95, will consult DM care.   # FWB - elevated DS (1/170) on cfDNA. reassuring. Declined further intervention - IUGR as above - BPP weekly - CEFM while on Mag - NICU consult - BMZ series (#2 due tomorrow, BMZ complete tomorrow)  # ROD:  - vertex - of note* has a history of a laparotomy with partial colectomy  # Dispo: AP  MD

## 2020-10-14 NOTE — Progress Notes (Signed)
HA now improved and 4/10

## 2020-10-15 ENCOUNTER — Inpatient Hospital Stay (HOSPITAL_COMMUNITY): Payer: 59 | Admitting: Anesthesiology

## 2020-10-15 ENCOUNTER — Encounter (HOSPITAL_COMMUNITY): Payer: Self-pay | Admitting: Obstetrics and Gynecology

## 2020-10-15 LAB — COMPREHENSIVE METABOLIC PANEL
ALT: 13 U/L (ref 0–44)
ALT: 15 U/L (ref 0–44)
AST: 18 U/L (ref 15–41)
AST: 23 U/L (ref 15–41)
Albumin: 2.5 g/dL — ABNORMAL LOW (ref 3.5–5.0)
Albumin: 2.5 g/dL — ABNORMAL LOW (ref 3.5–5.0)
Alkaline Phosphatase: 80 U/L (ref 38–126)
Alkaline Phosphatase: 81 U/L (ref 38–126)
Anion gap: 8 (ref 5–15)
Anion gap: 10 (ref 5–15)
BUN: 13 mg/dL (ref 6–20)
BUN: 11 mg/dL (ref 6–20)
CO2: 19 mmol/L — ABNORMAL LOW (ref 22–32)
CO2: 17 mmol/L — ABNORMAL LOW (ref 22–32)
Calcium: 6.4 mg/dL — CL (ref 8.9–10.3)
Calcium: 6 mg/dL — CL (ref 8.9–10.3)
Chloride: 107 mmol/L (ref 98–111)
Chloride: 108 mmol/L (ref 98–111)
Creatinine, Ser: 0.75 mg/dL (ref 0.44–1.00)
Creatinine, Ser: 0.7 mg/dL (ref 0.44–1.00)
GFR, Estimated: >60 mL/min (ref >60)
GFR, Estimated: >60 mL/min (ref >60)
Glucose, Bld: 128 mg/dL — ABNORMAL HIGH (ref 70–99)
Glucose, Bld: 108 mg/dL — ABNORMAL HIGH (ref 70–99)
Potassium: 4.4 mmol/L (ref 3.5–5.1)
Potassium: 4.3 mmol/L (ref 3.5–5.1)
Sodium: 134 mmol/L — ABNORMAL LOW (ref 135–145)
Sodium: 135 mmol/L (ref 135–145)
Total Bilirubin: 0.3 mg/dL (ref 0.3–1.2)
Total Bilirubin: 0.3 mg/dL (ref 0.3–1.2)
Total Protein: 5.7 g/dL — ABNORMAL LOW (ref 6.5–8.1)
Total Protein: 5.7 g/dL — ABNORMAL LOW (ref 6.5–8.1)

## 2020-10-15 LAB — CBC
HCT: 31.1 % — ABNORMAL LOW (ref 36.0–46.0)
HCT: 32.7 % — ABNORMAL LOW (ref 36.0–46.0)
HCT: 33 % — ABNORMAL LOW (ref 36.0–46.0)
Hemoglobin: 10.2 g/dL — ABNORMAL LOW (ref 12.0–15.0)
Hemoglobin: 10.9 g/dL — ABNORMAL LOW (ref 12.0–15.0)
Hemoglobin: 10.9 g/dL — ABNORMAL LOW (ref 12.0–15.0)
MCH: 28.6 pg (ref 26.0–34.0)
MCH: 28.8 pg (ref 26.0–34.0)
MCH: 28.8 pg (ref 26.0–34.0)
MCHC: 32.8 g/dL (ref 30.0–36.0)
MCHC: 33.3 g/dL (ref 30.0–36.0)
MCHC: 33 g/dL (ref 30.0–36.0)
MCV: 87.1 fL (ref 80.0–100.0)
MCV: 86.3 fL (ref 80.0–100.0)
MCV: 87.1 fL (ref 80.0–100.0)
Platelets: 247 10*3/uL (ref 150–400)
Platelets: 265 10*3/uL (ref 150–400)
Platelets: 251 10*3/uL (ref 150–400)
RBC: 3.57 MIL/uL — ABNORMAL LOW (ref 3.87–5.11)
RBC: 3.79 MIL/uL — ABNORMAL LOW (ref 3.87–5.11)
RBC: 3.79 MIL/uL — ABNORMAL LOW (ref 3.87–5.11)
RDW: 12.8 % (ref 11.5–15.5)
RDW: 12.9 % (ref 11.5–15.5)
RDW: 13 % (ref 11.5–15.5)
WBC: 12.9 10*3/uL — ABNORMAL HIGH (ref 4.0–10.5)
WBC: 13.6 10*3/uL — ABNORMAL HIGH (ref 4.0–10.5)
WBC: 17.7 10*3/uL — ABNORMAL HIGH (ref 4.0–10.5)
nRBC: 0 % (ref 0.0–0.2)
nRBC: 0 % (ref 0.0–0.2)
nRBC: 0 % (ref 0.0–0.2)

## 2020-10-15 LAB — GLUCOSE, CAPILLARY
Glucose-Capillary: 111 mg/dL — ABNORMAL HIGH (ref 70–99)
Glucose-Capillary: 108 mg/dL — ABNORMAL HIGH (ref 70–99)
Glucose-Capillary: 180 mg/dL — ABNORMAL HIGH (ref 70–99)
Glucose-Capillary: 95 mg/dL (ref 70–99)

## 2020-10-15 LAB — MAGNESIUM: Magnesium: 6.4 mg/dL (ref 1.7–2.4)

## 2020-10-15 MED ORDER — OXYCODONE-ACETAMINOPHEN 5-325 MG PO TABS: 2.0000 | ORAL_TABLET | ORAL | Status: DC | PRN

## 2020-10-15 MED ORDER — PHENYLEPHRINE 40 MCG/ML (10ML) SYRINGE FOR IV PUSH (FOR BLOOD PRESSURE SUPPORT): 80.0000 ug | PREFILLED_SYRINGE | INTRAVENOUS | Status: DC | PRN

## 2020-10-15 MED ORDER — BENZOCAINE-MENTHOL 20-0.5 % EX AERO: 1.0000 "application " | INHALATION_SPRAY | CUTANEOUS | Status: DC | PRN

## 2020-10-15 MED ORDER — DIPHENHYDRAMINE HCL 50 MG/ML IJ SOLN: 12.5000 mg | INTRAMUSCULAR | Status: DC | PRN

## 2020-10-15 MED ORDER — LABETALOL HCL 5 MG/ML IV SOLN: 20.0000 mg | INTRAVENOUS | Status: DC | PRN

## 2020-10-15 MED ORDER — LABETALOL HCL 200 MG PO TABS
300.0000 mg | ORAL_TABLET | Freq: Three times a day (TID) | ORAL | Status: DC
  Administered 2020-10-15 (×2): 300 mg via ORAL
  Filled 2020-10-15 (×2): qty 1

## 2020-10-15 MED ORDER — TERBUTALINE SULFATE 1 MG/ML IJ SOLN: 0.2500 mg | Freq: Once | INTRAMUSCULAR | Status: DC | PRN

## 2020-10-15 MED ORDER — OXYCODONE-ACETAMINOPHEN 5-325 MG PO TABS: 1.0000 | ORAL_TABLET | ORAL | Status: DC | PRN

## 2020-10-15 MED ORDER — PENICILLIN G POT IN DEXTROSE 60000 UNIT/ML IV SOLN
3.0000 10*6.[IU] | INTRAVENOUS | Status: DC
  Filled 2020-10-15: qty 50

## 2020-10-15 MED ORDER — LIDOCAINE HCL (PF) 1 % IJ SOLN
INTRAMUSCULAR | Status: DC | PRN
  Administered 2020-10-15 (×2): 4 mL via EPIDURAL

## 2020-10-15 MED ORDER — HYDROXYZINE HCL 50 MG PO TABS: 50.0000 mg | ORAL_TABLET | Freq: Four times a day (QID) | ORAL | Status: DC | PRN

## 2020-10-15 MED ORDER — LIDOCAINE HCL (PF) 1 % IJ SOLN: 30.0000 mL | INTRAMUSCULAR | Status: DC | PRN

## 2020-10-15 MED ORDER — SOD CITRATE-CITRIC ACID 500-334 MG/5ML PO SOLN: 30.0000 mL | ORAL | Status: DC | PRN

## 2020-10-15 MED ORDER — MAGNESIUM SULFATE 40 GM/1000ML IV SOLN: 1.0000 g/h | INTRAVENOUS | Status: DC

## 2020-10-15 MED ORDER — EPHEDRINE 5 MG/ML INJ: 10.0000 mg | INTRAVENOUS | Status: DC | PRN

## 2020-10-15 MED ORDER — TETANUS-DIPHTH-ACELL PERTUSSIS 5-2.5-18.5 LF-MCG/0.5 IM SUSY: 0.5000 mL | PREFILLED_SYRINGE | Freq: Once | INTRAMUSCULAR | Status: DC

## 2020-10-15 MED ORDER — PRENATAL MULTIVITAMIN CH
1.0000 | ORAL_TABLET | Freq: Every day | ORAL | Status: DC
  Administered 2020-10-16 – 2020-10-17 (×2): 1 via ORAL
  Filled 2020-10-15 (×2): qty 1

## 2020-10-15 MED ORDER — ACETAMINOPHEN 325 MG PO TABS: 650.0000 mg | ORAL_TABLET | ORAL | Status: DC | PRN

## 2020-10-15 MED ORDER — IBUPROFEN 600 MG PO TABS
600.0000 mg | ORAL_TABLET | Freq: Four times a day (QID) | ORAL | Status: DC
  Administered 2020-10-15: 600 mg via ORAL
  Filled 2020-10-15: qty 1

## 2020-10-15 MED ORDER — DIBUCAINE (PERIANAL) 1 % EX OINT: 1.0000 "application " | TOPICAL_OINTMENT | CUTANEOUS | Status: DC | PRN

## 2020-10-15 MED ORDER — ZOLPIDEM TARTRATE 5 MG PO TABS: 5.0000 mg | ORAL_TABLET | Freq: Every evening | ORAL | Status: DC | PRN

## 2020-10-15 MED ORDER — LABETALOL HCL 5 MG/ML IV SOLN
80.0000 mg | INTRAVENOUS | Status: DC | PRN
Start: 2020-10-15 — End: 2020-10-17

## 2020-10-15 MED ORDER — BUTORPHANOL TARTRATE 1 MG/ML IJ SOLN: 1.0000 mg | INTRAMUSCULAR | Status: DC | PRN

## 2020-10-15 MED ORDER — SIMETHICONE 80 MG PO CHEW
80.0000 mg | CHEWABLE_TABLET | ORAL | Status: DC | PRN
Start: 2020-10-15 — End: 2020-10-17

## 2020-10-15 MED ORDER — OXYTOCIN-SODIUM CHLORIDE 30-0.9 UT/500ML-% IV SOLN: 2.5000 [IU]/h | INTRAVENOUS | Status: DC

## 2020-10-15 MED ORDER — LACTATED RINGERS IV SOLN: INTRAVENOUS | Status: DC

## 2020-10-15 MED ORDER — SENNOSIDES-DOCUSATE SODIUM 8.6-50 MG PO TABS: 2.0000 | ORAL_TABLET | ORAL | Status: DC

## 2020-10-15 MED ORDER — OXYCODONE HCL 5 MG PO TABS
10.0000 mg | ORAL_TABLET | ORAL | Status: DC | PRN
Start: 2020-10-15 — End: 2020-10-17

## 2020-10-15 MED ORDER — ONDANSETRON HCL 4 MG/2ML IJ SOLN: 4.0000 mg | INTRAMUSCULAR | Status: DC | PRN

## 2020-10-15 MED ORDER — DIPHENHYDRAMINE HCL 25 MG PO CAPS: 25.0000 mg | ORAL_CAPSULE | Freq: Four times a day (QID) | ORAL | Status: DC | PRN

## 2020-10-15 MED ORDER — OXYTOCIN-SODIUM CHLORIDE 30-0.9 UT/500ML-% IV SOLN
1.0000 m[IU]/min | INTRAVENOUS | Status: DC
  Administered 2020-10-15: 2 m[IU]/min via INTRAVENOUS
  Filled 2020-10-15: qty 500

## 2020-10-15 MED ORDER — COCONUT OIL OIL
1.0000 "application " | TOPICAL_OIL | Status: DC | PRN
  Administered 2020-10-16: 1 via TOPICAL

## 2020-10-15 MED ORDER — CALCIUM CARBONATE ANTACID 1250 MG/5ML PO SUSP
500.0000 mg | Freq: Three times a day (TID) | ORAL | Status: DC
  Administered 2020-10-15 – 2020-10-17 (×6): 500 mg via ORAL
  Filled 2020-10-15 (×6): qty 5

## 2020-10-15 MED ORDER — ONDANSETRON HCL 4 MG PO TABS: 4.0000 mg | ORAL_TABLET | ORAL | Status: DC | PRN

## 2020-10-15 MED ORDER — CALCIUM CARBONATE ANTACID 1250 MG/5ML PO SUSP
500.0000 mg | Freq: Three times a day (TID) | ORAL | Status: DC
  Filled 2020-10-15: qty 5

## 2020-10-15 MED ORDER — OXYTOCIN BOLUS FROM INFUSION
333.0000 mL | Freq: Once | INTRAVENOUS | Status: AC
  Administered 2020-10-15: 333 mL via INTRAVENOUS

## 2020-10-15 MED ORDER — HYDRALAZINE HCL 10 MG PO TABS: 10.0000 mg | ORAL_TABLET | Freq: Four times a day (QID) | ORAL | Status: DC | PRN

## 2020-10-15 MED ORDER — LACTATED RINGERS IV SOLN
500.0000 mL | Freq: Once | INTRAVENOUS | Status: AC
  Administered 2020-10-15: 500 mL via INTRAVENOUS

## 2020-10-15 MED ORDER — ONDANSETRON HCL 4 MG/2ML IJ SOLN: 4.0000 mg | Freq: Four times a day (QID) | INTRAMUSCULAR | Status: DC | PRN

## 2020-10-15 MED ORDER — LACTATED RINGERS IV SOLN: 500.0000 mL | INTRAVENOUS | Status: DC | PRN

## 2020-10-15 MED ORDER — WITCH HAZEL-GLYCERIN EX PADS: 1.0000 "application " | MEDICATED_PAD | CUTANEOUS | Status: DC | PRN

## 2020-10-15 MED ORDER — SODIUM CHLORIDE 0.9 % IV SOLN
5.0000 10*6.[IU] | Freq: Once | INTRAVENOUS | Status: AC
  Administered 2020-10-15: 5 10*6.[IU] via INTRAVENOUS
  Filled 2020-10-15 (×2): qty 5

## 2020-10-15 MED ORDER — LABETALOL HCL 5 MG/ML IV SOLN: 40.0000 mg | INTRAVENOUS | Status: DC | PRN

## 2020-10-15 MED ORDER — FENTANYL-BUPIVACAINE-NACL 0.5-0.125-0.9 MG/250ML-% EP SOLN
12.0000 mL/h | EPIDURAL | Status: DC | PRN
Start: 2020-10-15 — End: 2020-10-15
  Administered 2020-10-15: 12 mL/h via EPIDURAL
  Filled 2020-10-15: qty 250

## 2020-10-15 NOTE — Progress Notes (Signed)
S: Was able to sleep but felt HA never actually went away. This AM, HA worse. New onset intermittent blurry vision. 7/10 HA. Does not feel like any meds have really helped at all. Denies SOB but did feel chest "tightening" yesterday that has resolved. Swelling is worse.   O:  Today's Vitals   10/15/20 0434 10/15/20 0536 10/15/20 0600 10/15/20 0630  BP: 131/85     Pulse: 91     Resp: 18 18 18 17   Temp: (!) 97.5 F (36.4 C)     TempSrc: Oral     SpO2: 99%     Weight:      Height:      PainSc: 7  Asleep  7    Body mass index is 32.04 kg/m.  NAD, A&O NWOB Abd soft, nondistended, gravid Reflexes now very brisk +3 Edema +2 bilateral and symmetric SVE 2.5-3/60/-2, vertex  A/P: 25yo G2P1001 admitted at 32.0 wga with severe PIH currently 32.2 wga about to be BMZ complete and with persistent and worsening symptoms. Given BMZ complete in a few hours, new onset visual changes, brisk reflexes, and persistent HA despite all of our intervention, I recommend proceeding with delivery. We discussed in detail this plan and the patient and her husband verbalize understanding and agree.   # Pre-eclampsia with severe features: HA as severe feature. - Cont Labetalol 300mg  TID, increase prn.  - Mag IV - mag level still pending this AM - CMP this AM still pending. CBC reassuring with normal platelets - s/p MFM consult and is in agreement with new plan - SVE 2.5-3/60/-2, vertex - plan pitocin and AROM  # IUGR: - EFW 3#4, 2%ile, AFI WNL - UAD WNL yesterday  # MWB: - anxiety: zoloft 25mg . - A1GDM: BS per intrapartum protocol  # FWB - elevated DS (1/170) on cfDNA. reassuring. Declined further intervention - IUGR as above - cat 1 tracing - NICU consult pending - ordered stat now given delivery planned - BMZ series complete at 1400 today  # ROD:  - vertex, SVD planned - of note* has a history of a laparotomy with partial colectomy  # GBS unknown and still pending. Thus will treat with PCN  presumtively.  # Dispo: Transfer to L&D   08-13-1971 MD

## 2020-10-15 NOTE — Progress Notes (Addendum)
Called with repeat critical value. Calcium now down to 6 from 6.4 even at 1g of Mag.  Calcium level corrected with albumin level and is 7.2 which is low but not quite at critical level. Suspect hypocalcemia iatrogenic s/s magnesium infusion. She remains completely asymptomatic.  Ionized calcium still pending.   Normotensive to mild range Bps - 1400 Labetalol not given as she delivered at that time. Thus, will go ahead and give mid day labetalol and ctm Bps closely off of Magnesium. Given Bps so well controlled, she is delivered, HA completely resolved, and other PIH s/s resolved, I feel that worsening hypocalcemia risk is higher than seizure risk at this time. If at any point her clinical picture changes, will consider another 24 hours of Magnesium.  Internal medicine consulted for calcium repletion recs and they agree with above plan. He recommends supplementation with 500mg  of calcium TID and doing daily CMPs.  Dr. stated he recommended holding EKG at this time given no symptoms and normal vitals.  If albumin lowers more, can consider 50g of albumin every 6 hours prn.

## 2020-10-15 NOTE — Progress Notes (Addendum)
Patient feeling better. HA currently slightly better. No s/s hypocalcemia. Ionized calcium still pending.  Feeling contractions every 10 min or so - just pressure. 2/10 pain.  BP normal to mild range.  PCN started @ 0930. Pitocin at 4.  SVE 4/60/-1, AROM clear fluid with head well applied to cervix. Mag @ 1 g/hr. Wants CLE asap given quick cervical change in 1 hour after starting pitocin. MD alerted.  CTM closely.

## 2020-10-15 NOTE — Lactation Note (Addendum)
This note was copied from a baby's chart. Lactation Consultation Note  [name] [id number] Date: 10/15/2020 Reason for consult: Initial assessment;Mother's request;NICU baby;Preterm <34wks;Infant < 6lbs;Other (Comment) (Gest HTN on labetalol) Age:27 hours   Mom resting on arrival. RN already set up the pump and got Mom pumping. LC supported Mom's efforts and reviewed pumping q 3hrs for 15 minutes.   Mom aware any EBM she collects to alert RN to carry to NICU.  NICU book provided. LC reviewed inpatient and outpatient services in the brochure provided.  Mom resting at this time. Overnight LC will provide further support later on.  Maternal Data    Feeding Mother's Current Feeding Choice: Breast Milk  LATCH Score                    Lactation Tools Discussed/Used Tools: Flanges;Pump Breast pump type: Double-Electric Breast Pump Reason for Pumping: increase stimulation Pumping frequency: every 3 hrs for 15 minutes  Interventions Interventions: Breast feeding basics reviewed;Education;DEBP  Discharge    Consult Status Consult Status: Follow-up Date: 10/16/20 Follow-up type: In-patient    Aidenn Skellenger  Nicholson-Springer 10/15/2020, 10:33 PM

## 2020-10-15 NOTE — Anesthesia Preprocedure Evaluation (Signed)
Anesthesia Evaluation  Patient identified by MRN, date of birth, ID band Patient awake    Reviewed: Allergy & Precautions, Patient's Chart, lab work & pertinent test results  Airway Mallampati: II  TM Distance: >3 FB Neck ROM: Full    Dental no notable dental hx. (+) Teeth Intact   Pulmonary neg pulmonary ROS,    Pulmonary exam normal breath sounds clear to auscultation       Cardiovascular hypertension, Normal cardiovascular exam Rhythm:Regular Rate:Normal     Neuro/Psych  Headaches, PSYCHIATRIC DISORDERS Anxiety Depression Bipolar Disorder    GI/Hepatic Neg liver ROS, GERD  ,  Endo/Other  diabetes, Well Controlled, GestationalObesity  Renal/GU Renal disease  negative genitourinary   Musculoskeletal   Abdominal (+) + obese,   Peds  Hematology   Anesthesia Other Findings   Reproductive/Obstetrics (+) Pregnancy 32 weeks Severe Pre eclampsia on MgSO4                             Anesthesia Physical Anesthesia Plan  ASA: III  Anesthesia Plan: Epidural   Post-op Pain Management:    Induction:   PONV Risk Score and Plan:   Airway Management Planned: Natural Airway  Additional Equipment:   Intra-op Plan:   Post-operative Plan:   Informed Consent: I have reviewed the patients History and Physical, chart, labs and discussed the procedure including the risks, benefits and alternatives for the proposed anesthesia with the patient or authorized representative who has indicated his/her understanding and acceptance.       Plan Discussed with: Anesthesiologist  Anesthesia Plan Comments:         Anesthesia Quick Evaluation

## 2020-10-15 NOTE — Consult Note (Signed)
[facility]  Prenatal Consult       10/15/2020  1:11 PM   I was asked by Dr. Elon Spanner to consult on this patient for expected preterm delivery. I had the pleasure of meeting with Joanne Herrera and her partner today. She is a 25 year old G2P1 woman currently at [redacted] weeks gestation. She and her partner are expected a baby girl, to be named Joanne Herrera. Joanne Herrera has A1GDM and now presents with severe pre-eclampsia prompting IOL. Of note, early screening showed increased risk of Trisomy 68. Joanne Herrera reports that subsequent anatomy scans were normal and they did not pursue further testing. We discussed that we would assess baby after birth for any signs of Trisomy 66.  I explained that the neonatal intensive care team would be present for the delivery and outlined the likely delivery room course for this baby including routine resuscitation and NRP-guided approaches to the treatment of respiratory distress. We discussed other common problems associated with prematurity including respiratory distress syndrome/CLD, feeding issues. We briefly discussed IVH/PVL, ROP, and NEC and that these are complications associated with prematurity, but that by 30 weeks are uncommon.   We discussed the average length of stay but I noted that the actual LOS would depend on the severity of problems encountered and response to treatments. We discussed visitation policies and the resources available while her child is in the hospital.  We discussed the importance of good nutrition and various methods of providing nutrition (parenteral hyperalimentation, gavage feedings and/or oral feeding). We discussed the benefits of human milk. I encouraged breast feeding and pumping soon after birth and outlined resources that are available to support breast feeding. We discussed the possibility of using donor breast milk as a bridge, to which parents are amenable. Mother is concerned about being able to bond with Joanne Herrera after  birth and we discussed the importance of skin-to-skin care, nuzzling at the breast, etc.   Thank you for involving [place] in the care of this patient. A member of our team will be available should the family have additional questions.  Time for consultation approximately 20 minutes.  Jacob Moores, MD Neonatal Medicine

## 2020-10-15 NOTE — Progress Notes (Signed)
Unable to trace contractions despite position changes and patient feeling them every few min. IUPC placed easily.  SVE same - 4/60/-1. Continue pitocin titration.

## 2020-10-15 NOTE — Progress Notes (Signed)
Dr Velvet Bathe notified of Magnesium order.  No new orders

## 2020-10-15 NOTE — Progress Notes (Signed)
Critical calcium level of 6.6. No symptoms.  Mag level still pending - no s/s Mag toxicity.  Given critically low Ca, will decrease Mag to 1g/hr.  Ionized calcium stat to see if meets infusion criteria.  CTM closely.

## 2020-10-15 NOTE — Plan of Care (Signed)
Pt demonstrated understanding 

## 2020-10-15 NOTE — Anesthesia Procedure Notes (Signed)

## 2020-10-15 NOTE — Progress Notes (Signed)
Pt visiting infant in NICU [id number]

## 2020-10-16 LAB — CALCIUM, IONIZED: Calcium, Ionized, Serum: 3.6 mg/dL — ABNORMAL LOW (ref 4.5–5.6)

## 2020-10-16 LAB — COMPREHENSIVE METABOLIC PANEL
ALT: 15 U/L (ref 0–44)
AST: 21 U/L (ref 15–41)
Albumin: 2.2 g/dL — ABNORMAL LOW (ref 3.5–5.0)
Alkaline Phosphatase: 67 U/L (ref 38–126)
Anion gap: 4 — ABNORMAL LOW (ref 5–15)
BUN: 11 mg/dL (ref 6–20)
CO2: 23 mmol/L (ref 22–32)
Calcium: 7.3 mg/dL — ABNORMAL LOW (ref 8.9–10.3)
Chloride: 109 mmol/L (ref 98–111)
Creatinine, Ser: 0.72 mg/dL (ref 0.44–1.00)
GFR, Estimated: >60 mL/min (ref >60)
Glucose, Bld: 84 mg/dL (ref 70–99)
Potassium: 4.6 mmol/L (ref 3.5–5.1)
Sodium: 136 mmol/L (ref 135–145)
Total Bilirubin: 0.5 mg/dL (ref 0.3–1.2)
Total Protein: 4.9 g/dL — ABNORMAL LOW (ref 6.5–8.1)

## 2020-10-16 LAB — CBC
HCT: 29 % — ABNORMAL LOW (ref 36.0–46.0)
Hemoglobin: 9.4 g/dL — ABNORMAL LOW (ref 12.0–15.0)
MCH: 28.3 pg (ref 26.0–34.0)
MCHC: 32.4 g/dL (ref 30.0–36.0)
MCV: 87.3 fL (ref 80.0–100.0)
Platelets: 223 10*3/uL (ref 150–400)
RBC: 3.32 MIL/uL — ABNORMAL LOW (ref 3.87–5.11)
RDW: 13.1 % (ref 11.5–15.5)
WBC: 13 10*3/uL — ABNORMAL HIGH (ref 4.0–10.5)
nRBC: 0 % (ref 0.0–0.2)

## 2020-10-16 LAB — RPR: RPR Ser Ql: NONREACTIVE

## 2020-10-16 MED ORDER — LABETALOL HCL 200 MG PO TABS
400.0000 mg | ORAL_TABLET | Freq: Three times a day (TID) | ORAL | Status: DC
  Administered 2020-10-16 – 2020-10-17 (×4): 400 mg via ORAL
  Filled 2020-10-16 (×4): qty 2

## 2020-10-16 MED ORDER — NIFEDIPINE ER OSMOTIC RELEASE 30 MG PO TB24
30.0000 mg | ORAL_TABLET | Freq: Every day | ORAL | Status: DC
  Administered 2020-10-16 – 2020-10-17 (×2): 30 mg via ORAL
  Filled 2020-10-16 (×2): qty 1

## 2020-10-16 NOTE — Anesthesia Postprocedure Evaluation (Signed)
Anesthesia Post Note  Patient: EMILYANN BANKA  Procedure(s) Performed: AN AD HOC LABOR EPIDURAL     Patient location during evaluation: Mother Baby Anesthesia Type: Epidural Level of consciousness: awake and alert and oriented Pain management: satisfactory to patient Vital Signs Assessment: post-procedure vital signs reviewed and stable Respiratory status: spontaneous breathing and nonlabored ventilation Cardiovascular status: stable Postop Assessment: no headache, no backache, no signs of nausea or vomiting, adequate PO intake, patient able to bend at knees and able to ambulate (patient up walking) Anesthetic complications: no   No complications documented.  Last Vitals:  Vitals:   10/16/20 0313 10/16/20 0728  BP: (!) 153/91 (!) 154/98  Pulse: 68 67  Resp:  18  Temp: 36.8 C 37.2 C  SpO2: 98% 98%    Last Pain:  Vitals:   10/16/20 0728  TempSrc: Oral  PainSc:    Pain Goal:                   Madison Hickman

## 2020-10-16 NOTE — Progress Notes (Signed)
Post Partum Day 1 Subjective: no complaints, up ad lib, voiding and tolerating PO. Feeling so much better. Denies any s/s PIH including HA, visual change, etc.  Objective: Blood pressure (!) 153/91, pulse 68, temperature 98.3 F (36.8 C), temperature source Oral, resp. rate 18, height 5\' 2"  (1.575 m), weight 79.5 kg, last menstrual period 03/03/2020, SpO2 98 %, unknown if currently breastfeeding.  Physical Exam:  General: alert Lochia: appropriate Uterine Fundus: firm Incision: n/a DVT Evaluation: No evidence of DVT seen on physical exam. Reflexes +2  Recent Labs    10/15/20 1716 10/16/20 0504  HGB 10.9* 9.4*  HCT 33.0* 29.0*    Assessment/Plan: PPD#1 s/p SVD of 32 week s/s severe PIH.  Mag stopped s/s critical hypocalcemia. Improved today. Continue supplementation. Albumin level still low but not critical and patient well appearing. Defer albumin administration at this time.  Bps mostly 130s/90s off Mag but one 150/90 elevation this AM. Will increase Labetalol to 400mg  TID.  CTM closely.  Baby girl Chloe stable in NICU.     LOS: 2 days   10/18/20 10/16/2020, 6:37 AM

## 2020-10-16 NOTE — Lactation Note (Signed)
This note was copied from a baby's chart. Lactation Consultation Note  [name] [id number] Date: 10/16/2020 Reason for consult: Follow-up assessment;NICU baby;Preterm <34wks;Infant < 6lbs;Maternal endocrine disorder Age:27 hours  LC in to visit with P2 Mom of infant in the NICU.  Baby is stable on CPAP and weaning from O2 support.  Mom struggled with producing with her first baby, born at 46 weeks.  Mom already has pumped volume to provide for baby.  Mom is very encouraged this time.  Reviewed importance of double pump on initiation setting every 2-3 hrs when awake, adding breast massage and hand expression.  Elastic band given to Mom with instructions on how to create a hand's free pumping band.  Mom also aware of STS importance when she is able to.   Mom was planning to get a DEBP from insurance.  Encouraged her to call Monday.  Mom given flyer about pumps available to rent in gift shop.  Mom also shown the pump in baby's room for her to use. Cooler bag provided for transport of EBM from home to NICU.    Lactation Tools Discussed/Used Tools: Pump Breast pump type: Double-Electric Breast Pump Pumping frequency: Q 2-3 hrs Pumped volume: 10 mL  Interventions Interventions: Skin to skin;Breast massage;Hand express;DEBP;Education  Discharge Pump: Refer for rental;Advised to call insurance company [facility] Program: No  Consult Status Consult Status: Follow-up Date: 10/17/20 Follow-up type: In-patient    Joanne Herrera 10/16/2020, 9:37 AM

## 2020-10-17 LAB — COMPREHENSIVE METABOLIC PANEL
ALT: 21 U/L (ref 0–44)
AST: 29 U/L (ref 15–41)
Albumin: 2.3 g/dL — ABNORMAL LOW (ref 3.5–5.0)
Alkaline Phosphatase: 57 U/L (ref 38–126)
Anion gap: 10 (ref 5–15)
BUN: 13 mg/dL (ref 6–20)
CO2: 21 mmol/L — ABNORMAL LOW (ref 22–32)
Calcium: 8.5 mg/dL — ABNORMAL LOW (ref 8.9–10.3)
Chloride: 105 mmol/L (ref 98–111)
Creatinine, Ser: 0.68 mg/dL (ref 0.44–1.00)
GFR, Estimated: >60 mL/min (ref >60)
Glucose, Bld: 74 mg/dL (ref 70–99)
Potassium: 4.7 mmol/L (ref 3.5–5.1)
Sodium: 136 mmol/L (ref 135–145)
Total Bilirubin: 0.6 mg/dL (ref 0.3–1.2)
Total Protein: 5.2 g/dL — ABNORMAL LOW (ref 6.5–8.1)

## 2020-10-17 LAB — CULTURE, BETA STREP (GROUP B ONLY)

## 2020-10-17 MED ORDER — NIFEDIPINE ER 30 MG PO TB24: 30.0000 mg | ORAL_TABLET | Freq: Every day | ORAL | 0 refills | Status: AC

## 2020-10-17 MED ORDER — LABETALOL HCL 200 MG PO TABS: 400.0000 mg | ORAL_TABLET | Freq: Three times a day (TID) | ORAL | 0 refills | Status: AC

## 2020-10-17 MED ORDER — SERTRALINE HCL 50 MG PO TABS: 50.0000 mg | ORAL_TABLET | Freq: Every day | ORAL | 3 refills | Status: AC

## 2020-10-17 NOTE — Discharge Summary (Signed)
Postpartum Discharge Summary       [name] DOB: [birth year] MRN: [id number]  Date of admission: 10/13/2020 Delivery date:10/15/2020  Delivering provider: [name]  Date of discharge: 10/17/2020  Admitting diagnosis: Preeclampsia [O14.90] Pre-eclampsia [O14.90] Intrauterine pregnancy: [redacted]w[redacted]d     Secondary diagnosis:  Active Problems:   Preeclampsia   Pre-eclampsia  Additional problems:      Discharge diagnosis: Preterm Pregnancy Delivered, Severe Preeclampsia                                              Post partum procedures:  Augmentation: AROM and Pitocin Complications: None  Hospital course: Induction of Labor With Vaginal Delivery   25 y.o. yo G2P1102 at [redacted]w[redacted]d was admitted to the hospital 10/13/2020 for induction of labor.  Indication for induction: Gestational hypertension.  Patient had an uncomplicated labor course as follows: Membrane Rupture Time/Date: 10:55 AM ,10/15/2020   Delivery Method:Vaginal, Spontaneous  Episiotomy: None  Lacerations:  None  Details of delivery can be found in separate delivery note.  Patient had a routine postpartum course. Patient is discharged home 10/17/20.  Newborn Data: Birth date:10/15/2020  Birth time:2:28 PM  Gender:Female  Living status:Living  Apgars:7 ,8  Weight:1340 g   Magnesium Sulfate received: Yes: Seizure prophylaxis BMZ received: Yes Rhophylac:N/A MMR:N/A T-DaP:Given prenatally Flu: Yes Transfusion:No  Physical exam  Vitals:   10/16/20 2242 10/16/20 2330 10/17/20 0406 10/17/20 0721  BP:  124/74 131/82 140/90  Pulse: 72 68 67 77  Resp:  $Remo'16 16 16  'Bmuua$ Temp:  98.3 F (36.8 C) 98.8 F (37.1 C) 99.1 F (37.3 C)  TempSrc:  Oral Oral Oral  SpO2:  100% 98% 100%  Weight:      Height:       General: alert, cooperative and no distress Lochia: appropriate Uterine Fundus: firm Incision: N/A DVT Evaluation: Calf/Ankle edema is present Labs: Lab Results  Component Value Date   WBC 13.0 (H)  10/16/2020   HGB 9.4 (L) 10/16/2020   HCT 29.0 (L) 10/16/2020   MCV 87.3 10/16/2020   PLT 223 10/16/2020   CMP Latest Ref Rng & Units 10/17/2020  Glucose 70 - 99 mg/dL 74  BUN 6 - 20 mg/dL 13  Creatinine 0.44 - 1.00 mg/dL 0.68  Sodium 135 - 145 mmol/L 136  Potassium 3.5 - 5.1 mmol/L 4.7  Chloride 98 - 111 mmol/L 105  CO2 22 - 32 mmol/L 21(L)  Calcium 8.9 - 10.3 mg/dL 8.5(L)  Total Protein 6.5 - 8.1 g/dL 5.2(L)  Total Bilirubin 0.3 - 1.2 mg/dL 0.6  Alkaline Phos 38 - 126 U/L 57  AST 15 - 41 U/L 29  ALT 0 - 44 U/L 21   Edinburgh Score: Edinburgh Postnatal Depression Scale Screening Tool 10/17/2020  I have been able to laugh and see the funny side of things. 0  I have looked forward with enjoyment to things. 0  I have blamed myself unnecessarily when things went wrong. 0  I have been anxious or worried for no good reason. 0  I have felt scared or panicky for no good reason. 0  Things have been getting on top of me. 0  I have been so unhappy that I have had difficulty sleeping. 0  I have felt sad or miserable. 0  I have been so unhappy that I have been crying. 0  The thought of harming myself has occurred to me. 0  Edinburgh Postnatal Depression Scale Total 0      After visit meds:  Allergies as of 10/17/2020   No Known Allergies     Medication List    TAKE these medications   acetaminophen 325 MG tablet Commonly known as: TYLENOL Take 2 tablets (650 mg total) by mouth every 6 (six) hours as needed (or Temp > 100).   cetirizine HCl 1 MG/ML solution Commonly known as: ZYRTEC Take 10 mg by mouth daily.   labetalol 200 MG tablet Commonly known as: NORMODYNE Take 2 tablets (400 mg total) by mouth every 8 (eight) hours.   multivitamin-prenatal 27-0.8 MG Tabs tablet Take 1 tablet by mouth daily at 12 noon.   NIFEdipine 30 MG 24 hr tablet Commonly known as: ADALAT CC Take 1 tablet (30 mg total) by mouth daily. Start taking on: October 18, 2020   ondansetron 4 MG  disintegrating tablet Commonly known as: ZOFRAN-ODT Take 4 mg by mouth every 8 (eight) hours as needed for nausea or vomiting.   promethazine 12.5 MG tablet Commonly known as: PHENERGAN Take 12.5 mg by mouth every 6 (six) hours as needed for nausea or vomiting.   sertraline 50 MG tablet Commonly known as: ZOLOFT Take 1 tablet (50 mg total) by mouth daily. What changed:   medication strength  how much to take        Discharge home in stable condition Infant Feeding: Breast Infant Disposition:NICU Discharge instruction: per After Visit Summary and Postpartum booklet. Activity: Advance as tolerated. Pelvic rest for 6 weeks.  Diet: routine diet Anticipated Birth Control: Unsure Postpartum Appointment:2-3 days Additional Postpartum F/U: BP check 2-3 days Future Appointments:No future appointments. Follow up Visit:      10/17/2020 Luz Lex, MD

## 2020-10-17 NOTE — Progress Notes (Addendum)
Pt given discharge instructions. Signs and symptoms of preeclampsia reviewed with patient and reasons to call OB doctor's office or go to the hospital. Pt verbalized understanding.  All questions answered. IV discontinued. Pt discharge in stable condition.

## 2020-10-18 LAB — SURGICAL PATHOLOGY

## 2020-10-19 ENCOUNTER — Ambulatory Visit: Payer: Self-pay

## 2020-10-19 NOTE — Lactation Note (Signed)
This note was copied from a baby's chart. Lactation Consultation Note Infant to transfer to [facility]. Mom with increasing milk volume but denies engorgement. She will f/u with LC at [facility] for continued LC support prn.   [name] IFOYD'X Date: 10/19/2020 Reason for consult: NICU baby;Follow-up assessment Age:27 days   Feeding Mother's Current Feeding Choice: Breast Milk and Donor Milk  Discharge Discharge Education: Engorgement and breast care  Consult Status Consult Status: Complete Follow-up type: In-patient   Elder Negus, MA IBCLC 10/19/2020, 4:31 PM

## 2020-12-08 ENCOUNTER — Inpatient Hospital Stay (HOSPITAL_COMMUNITY): Admit: 2020-12-08 | Payer: Self-pay

## 2021-06-27 ENCOUNTER — Other Ambulatory Visit: Payer: Self-pay | Admitting: Gastroenterology

## 2021-06-27 DIAGNOSIS — R109 Unspecified abdominal pain: Secondary | ICD-10-CM

## 2021-07-19 ENCOUNTER — Other Ambulatory Visit: Payer: 59

## 2021-07-20 ENCOUNTER — Other Ambulatory Visit: Payer: Self-pay

## 2021-07-20 ENCOUNTER — Ambulatory Visit
Admission: RE | Admit: 2021-07-20 | Discharge: 2021-07-20 | Disposition: A | Discharge status: Home / Self Care | Payer: 59 | Source: Ambulatory Visit | Attending: Gastroenterology | Admitting: Gastroenterology

## 2021-07-20 DIAGNOSIS — R109 Unspecified abdominal pain: Secondary | ICD-10-CM

## 2021-07-20 MED ORDER — IOPAMIDOL (ISOVUE-300) INJECTION 61%
100.0000 mL | Freq: Once | INTRAVENOUS | Status: AC | PRN
  Administered 2021-07-20: 100 mL via INTRAVENOUS

## 2021-07-21 ENCOUNTER — Other Ambulatory Visit: Payer: Self-pay | Admitting: Gastroenterology

## 2021-07-21 DIAGNOSIS — R932 Abnormal findings on diagnostic imaging of liver and biliary tract: Secondary | ICD-10-CM

## 2021-07-21 DIAGNOSIS — K769 Liver disease, unspecified: Secondary | ICD-10-CM

## 2021-08-01 ENCOUNTER — Other Ambulatory Visit: Payer: Self-pay

## 2021-08-01 ENCOUNTER — Ambulatory Visit
Admission: RE | Admit: 2021-08-01 | Discharge: 2021-08-01 | Disposition: A | Discharge status: Home / Self Care | Payer: 59 | Source: Ambulatory Visit | Attending: Gastroenterology | Admitting: Gastroenterology

## 2021-08-01 DIAGNOSIS — K769 Liver disease, unspecified: Secondary | ICD-10-CM

## 2021-08-01 DIAGNOSIS — R932 Abnormal findings on diagnostic imaging of liver and biliary tract: Secondary | ICD-10-CM

## 2021-08-01 MED ORDER — GADOBENATE DIMEGLUMINE 529 MG/ML IV SOLN
7.0000 mL | Freq: Once | INTRAVENOUS | Status: AC | PRN
  Administered 2021-08-01: 7 mL via INTRAVENOUS

## 2022-01-31 ENCOUNTER — Other Ambulatory Visit: Payer: Self-pay | Admitting: Gastroenterology

## 2022-01-31 DIAGNOSIS — K769 Liver disease, unspecified: Secondary | ICD-10-CM

## 2022-02-20 ENCOUNTER — Other Ambulatory Visit: Payer: 59

## 2022-03-08 ENCOUNTER — Ambulatory Visit
Admission: RE | Admit: 2022-03-08 | Discharge: 2022-03-08 | Disposition: A | Discharge status: Home / Self Care | Payer: 59 | Source: Ambulatory Visit | Attending: Gastroenterology | Admitting: Gastroenterology

## 2022-03-08 DIAGNOSIS — K769 Liver disease, unspecified: Secondary | ICD-10-CM

## 2022-03-08 MED ORDER — GADOXETATE DISODIUM 0.25 MMOL/ML IV SOLN
8.0000 mL | Freq: Once | INTRAVENOUS | Status: AC | PRN
  Administered 2022-03-08: 8 mL via INTRAVENOUS

## 2022-08-14 ENCOUNTER — Ambulatory Visit: Payer: 59 | Attending: Student | Admitting: Physical Therapy

## 2022-08-14 ENCOUNTER — Encounter: Payer: Self-pay | Admitting: Physical Therapy

## 2022-08-14 DIAGNOSIS — M5459 Other low back pain: Secondary | ICD-10-CM | POA: Diagnosis present

## 2022-08-14 DIAGNOSIS — M2530 Other instability, unspecified joint: Secondary | ICD-10-CM | POA: Insufficient documentation

## 2022-08-14 DIAGNOSIS — M5416 Radiculopathy, lumbar region: Secondary | ICD-10-CM | POA: Diagnosis present

## 2022-08-14 NOTE — Therapy (Unsigned)
OUTPATIENT PHYSICAL THERAPY THORACOLUMBAR EVALUATION   [name] MRN: [id number] DOB:21-Jun-1996, 27 y.o., female Today's Date: 08/14/2022  END OF SESSION:  PT End of Session - 08/14/22 0941     Visit Number 1    Number of Visits 12    Date for PT Re-Evaluation 09/25/22    Authorization Type UHC , MCD [facility]    PT Start Time [phone]    PT Stop Time 1016    PT Time Calculation (min) 43 min    Activity Tolerance Patient tolerated treatment well    Behavior During Therapy [facility] for tasks assessed/performed             Past Medical History:  Diagnosis Date   Abdominal pain, acute, right lower quadrant 02/11/2012    Possible Appendix perforation, contained perforation, typhlitis, or Crohn's dz.     Anxiety    Bipolar depression ([place])    Depression    IBS (irritable bowel syndrome)    Past Surgical History:  Procedure Laterality Date   APPENDECTOMY     COLONOSCOPY     LAPAROSCOPY  06/02/2012   Procedure: LAPAROSCOPY DIAGNOSTIC;  Surgeon: Gwenyth Ober, MD;  Location: [place];  Service: General;  Laterality: N/A;   LAPAROTOMY  06/02/2012   Procedure: EXPLORATORY LAPAROTOMY;  Surgeon: Gwenyth Ober, MD;  Location: [place];  Service: General;  Laterality: N/A;   LASIK     PARTIAL COLECTOMY  06/02/2012   Procedure: PARTIAL COLECTOMY;  Surgeon: Gwenyth Ober, MD;  Location: [place];  Service: General;  Laterality: Right;   Patient Active Problem List   Diagnosis Date Noted   Pre-eclampsia 10/14/2020   Preeclampsia 10/13/2020   Gestational diabetes mellitus (GDM), antepartum 09/21/2020   Normal labor 09/28/2018   SVD (spontaneous vaginal delivery) 09/28/2018   Postop check 06/24/2012   Chronic appendicitis 04/15/2012   Acute renal insufficiency 04/08/2012   Typhlitis 02/26/2012   Abdominal pain, acute, right lower quadrant 02/11/2012   Bipolar depression ([place])     PCP: Daiva Eves MD   REFERRING PROVIDER: Eleonore Chiquito NP  REFERRING DIAG: lumbar  radiculopathy  Rationale for Evaluation and Treatment: Rehabilitation  THERAPY DIAG:  Other low back pain  Joint instability  Radiculopathy, lumbar region  ONSET DATE: chronic   SUBJECTIVE:                                                                                                                                                                                           SUBJECTIVE STATEMENT: "I have always had back pain, but now it is causing some numbness in my LLE" The  patient reports that her back pain is chronic, intermittent and without a definitve pattern of presentation.  She has 2 children and since the birth of her 2nd child it has gotten worse.  She has a sense of twisting in her spine.   LLE was numb, weak and painful most recently last night..  She has pain Lt lateral thigh to the knee. Sometimes it will go to the Rt LE.  MRI is tomorrow.  Se reports some difficulty with childcare, work and daily tasks of self care.  She is still exercising, currently doing ab work, legs. Exercises do not agg her pain but she feels weak in her LLE and core.  She has not had PT for this before.    PERTINENT HISTORY:  GI issues, colonecomy,see above   PAIN:  Are you having pain? Yes: NPRS scale: 0/10 Pain location: back and LLE  Pain description: sharp stabbing pain in back, LE burns  Aggravating factors: is aleady hurting she has increased pain  Relieving factors: leaning, varies standing vs sitting, not lying down, prone better than supine   Pain last night she was in tears, 7/10-8/10. She was cooking and standing. She has been unable to relate the pain to activity.   PRECAUTIONS: None  WEIGHT BEARING RESTRICTIONS: No  FALLS:  Has patient fallen in last 6 months? Yes. Number of falls 1, slipped and fell on her steps  She did not have it checked out but she was very concerned at the time.   LIVING ENVIRONMENT: Lives with: lives with their family Lives in:  House/apartment Stairs: Yes: External: 4 steps; on right going up and to the downstairs with rails, no rails outside  Has following equipment at home: None  29 yr old and 29 yr old at home, reports with her 28 yr old she had pre-clampsia and was a difficult pregnancy due to pain    OCCUPATION: works at [employer]   PLOF: [place]: Patient would like to figure out what is going.   NEXT MD VISIT: MRI tomorrow and follow up at [facility]  OBJECTIVE:   DIAGNOSTIC FINDINGS:  Pending 08/15/22  PATIENT SURVEYS:  FOTO 56% to 76% goal   SCREENING FOR RED FLAGS: Bowel or bladder incontinence: No Spinal tumors: No Cauda equina syndrome: No Compression fracture: No  Abdominal aneurysm: No  COGNITION: Overall cognitive status: Within functional limits for tasks assessed     SENSATION: Normal other than radicular sx but none currently   MUSCLE LENGTH: Hamstrings: WNL about 50-55 deg  Thomas test: WNL   POSTURE: rounded shoulders, forward head, and increased thoracic kyphosis, sway back   PALPATION: Hypermobile throughout her thoracic and lumbar spine Pain at T6-T7 and down to sacrum centrally No  TTP in gluteals bilaterally or hamstrings in prone   LUMBAR ROM:   AROM eval  Flexion WFL pain in legs   Extension WFL with pain central pain in spine L4-L5  Right lateral flexion WFL with pain L   Left lateral flexion WFL with pain R   Right rotation WFL no pain   Left rotation WFL no pain    (Blank rows = not tested)  LOWER EXTREMITY ROM:   WFL   Active  Right eval Left eval  Hip flexion    Hip extension    Hip abduction    Hip adduction    Hip internal rotation    Hip external rotation    Knee flexion  Knee extension    Ankle dorsiflexion    Ankle plantarflexion    Ankle inversion    Ankle eversion     (Blank rows = not tested)  LOWER EXTREMITY MMT:    MMT Right eval Left eval  Hip flexion 5/5 5/5 min pain   Hip  extension 4/5 pain 4/5 pain   Hip abduction 4/5 4/5  Hip adduction    Hip internal rotation    Hip external rotation    Knee flexion 5/5 5/5  Knee extension 5/5 5/5  Ankle dorsiflexion 5/5 5/5  Ankle plantarflexion    Ankle inversion    Ankle eversion     (Blank rows = not tested)  LUMBAR SPECIAL TESTS:  Straight leg raise test: pain end range , Slump test: Positive, and Single leg stance test: Negative  FUNCTIONAL TESTS:  NT  GAIT: Distance walked: WFL, 150 feet Assistive device utilized:  Level of assistance: NoneComplete Independence Comments: NT   TODAY'S TREATMENT:                                                                                                                              DATE: 08/14/22  PT eval, discussed disc pressure with standing vs sitting, posture, squatting vs hinging and mobility/stability in spine    PATIENT EDUCATION:  Education details: see just above . Given printout of Lumbar stabilization: TrA, multifidus and pelvic floor  Person educated: Patient Education method: Explanation, Demonstration, and Handouts Education comprehension: verbalized understanding, returned demonstration, and needs further education  HOME EXERCISE PROGRAM: L stab I  TrA with heel slide, march and clam (not in Med Bridge)  ASSESSMENT:  CLINICAL IMPRESSION: Patient is a 27 y.o. female who was seen today for physical therapy evaluation and treatment for low back pain with lumbar radiculopathy in L4-L5 distrubution. She does have an MRI tomorrow.  Suspect joint hypermobility/instability plays a role in her pain. She also displayed a faulty movement pattern when demonstrating squat, describing ab work.    OBJECTIVE IMPAIRMENTS: decreased mobility, difficulty walking, decreased ROM, decreased strength, increased fascial restrictions, impaired flexibility, improper body mechanics, postural dysfunction, pain, and hypermobility .   ACTIVITY LIMITATIONS: carrying, lifting,  bending, sitting, standing, squatting, sleeping, locomotion level, and caring for others  PARTICIPATION LIMITATIONS: laundry, interpersonal relationship, shopping, community activity, and occupation  PERSONAL FACTORS: Time since onset of injury/illness/exacerbation and 1-2 comorbidities: Prior abdominal surgery, chronic pain  are also affecting patient's functional outcome.   REHAB POTENTIAL: Excellent  CLINICAL DECISION MAKING: Stable/uncomplicated  EVALUATION COMPLEXITY: Low   GOALS: Goals reviewed with patient? Yes    LONG TERM GOALS: Target date: 09/26/2022    Patient will be independent in home exercise program for core stability Baseline: Goal status: INITIAL  2.  Patient will understand and demonstrate proper posture and lifting in order to reduce and prevent back pain  Baseline:  Goal status: INITIAL  3.  Patient will report 25% less leg symptoms with ADLs (  pain centralization) Baseline:  Goal status: INITIAL  4.  Patient will be able to report no increased pain with sitting 30 minutes or more in order to drive Baseline:  Goal status: INITIAL  5.  Patient will be able to stand and walk without limitation of pain for 30 minutes for shopping, community mobility. Baseline:  Goal status: INITIAL  3.  FOTO score will improve to 76% Baseline: 56% Goal status: INITIAL    PLAN:  PT FREQUENCY: 1x/week  PT DURATION: 8 weeks  PLANNED INTERVENTIONS: Therapeutic exercises, Therapeutic activity, Neuromuscular re-education, Balance training, Gait training, Patient/Family education, Self Care, Joint mobilization, Electrical stimulation, Spinal mobilization, Cryotherapy, Moist heat, Taping, Manual therapy, and Re-evaluation.  PLAN FOR NEXT SESSION: check HEP, core stab.  Posture, lifting    Zyshawn Bohnenkamp, PT 08/14/2022, 6:07 PM   Raeford Razor, PT 08/15/22 9:33 AM Phone: [phone] Fax: [phone]

## 2022-08-28 ENCOUNTER — Encounter: Payer: Self-pay | Admitting: Physical Therapy

## 2022-08-28 ENCOUNTER — Ambulatory Visit: Payer: 59 | Attending: Student | Admitting: Physical Therapy

## 2022-08-28 DIAGNOSIS — M5459 Other low back pain: Secondary | ICD-10-CM | POA: Diagnosis present

## 2022-08-28 DIAGNOSIS — M5416 Radiculopathy, lumbar region: Secondary | ICD-10-CM | POA: Diagnosis present

## 2022-08-28 DIAGNOSIS — M2530 Other instability, unspecified joint: Secondary | ICD-10-CM

## 2022-08-28 NOTE — Therapy (Signed)
OUTPATIENT PHYSICAL THERAPY TREATMENT NOTE   [name] MRN: [id number] DOB:September 01, 1995, 27 y.o., female Today's Date: 08/28/2022  PCP: Daiva Eves MD    REFERRING PROVIDER: Eleonore Chiquito NP  END OF SESSION:   PT End of Session - 08/28/22 1357     Visit Number 2    Number of Visits 12    Date for PT Re-Evaluation 09/25/22    Authorization Type UHC , MCD [employer]    PT Start Time 1400    PT Stop Time 1445    PT Time Calculation (min) 45 min             Past Medical History:  Diagnosis Date   Abdominal pain, acute, right lower quadrant 02/11/2012    Possible Appendix perforation, contained perforation, typhlitis, or Crohn's dz.     Anxiety    Bipolar depression ([place])    Depression    IBS (irritable bowel syndrome)    Past Surgical History:  Procedure Laterality Date   APPENDECTOMY     COLONOSCOPY     LAPAROSCOPY  06/02/2012   Procedure: LAPAROSCOPY DIAGNOSTIC;  Surgeon: Gwenyth Ober, MD;  Location: [place];  Service: General;  Laterality: N/A;   LAPAROTOMY  06/02/2012   Procedure: EXPLORATORY LAPAROTOMY;  Surgeon: Gwenyth Ober, MD;  Location: [place];  Service: General;  Laterality: N/A;   LASIK     PARTIAL COLECTOMY  06/02/2012   Procedure: PARTIAL COLECTOMY;  Surgeon: Gwenyth Ober, MD;  Location: [place];  Service: General;  Laterality: Right;   Patient Active Problem List   Diagnosis Date Noted   Pre-eclampsia 10/14/2020   Preeclampsia 10/13/2020   Gestational diabetes mellitus (GDM), antepartum 09/21/2020   Normal labor 09/28/2018   SVD (spontaneous vaginal delivery) 09/28/2018   Postop check 06/24/2012   Chronic appendicitis 04/15/2012   Acute renal insufficiency 04/08/2012   Typhlitis 02/26/2012   Abdominal pain, acute, right lower quadrant 02/11/2012   Bipolar depression ([place])     REFERRING DIAG: lumbar radiculopathy  THERAPY DIAG:  Other low back pain  Radiculopathy, lumbar region  Joint instability  Rationale for  Evaluation and Treatment Rehabilitation  PERTINENT HISTORY:  GI issues, colonecomy,see above   PRECAUTIONS: None   WEIGHT BEARING RESTRICTIONS: No  SUBJECTIVE:                                                                                                                                                                                      SUBJECTIVE STATEMENT:  Back is a steady 4/10.  I went to the zoo today and it's better when I have the stroller to take some of the  weight off my back.    PAIN:  Are you having pain? Yes: NPRS scale: 4/10 Pain location: back  Pain description: sharp stabbing pain in back, LE burns  Aggravating factors: is aleady hurting she has increased pain  Relieving factors: leaning, varies standing vs sitting, not lying down, prone better than supine    Pain last night she was in tears, 7/10-8/10. She was cooking and standing. She has been unable to relate the pain to activity.    OBJECTIVE: (objective measures completed at initial evaluation unless otherwise dated)   DIAGNOSTIC FINDINGS:  Pending 08/15/22   PATIENT SURVEYS:  FOTO 56% to 76% goal    SCREENING FOR RED FLAGS: Bowel or bladder incontinence: No Spinal tumors: No Cauda equina syndrome: No Compression fracture: No  Abdominal aneurysm: No   COGNITION: Overall cognitive status: Within functional limits for tasks assessed                          SENSATION: Normal other than radicular sx but none currently    MUSCLE LENGTH: Hamstrings: WNL about 50-55 deg  Thomas test: WNL    POSTURE: rounded shoulders, forward head, and increased thoracic kyphosis, sway back    PALPATION: Hypermobile throughout her thoracic and lumbar spine Pain at T6-T7 and down to sacrum centrally No  TTP in gluteals bilaterally or hamstrings in prone    LUMBAR ROM:    AROM eval  Flexion WFL pain in legs   Extension WFL with pain central pain in spine L4-L5  Right lateral flexion WFL with pain L   Left  lateral flexion WFL with pain R   Right rotation WFL no pain   Left rotation WFL no pain    (Blank rows = not tested)   LOWER EXTREMITY ROM:   WFL    Active  Right eval Left eval  Hip flexion      Hip extension      Hip abduction      Hip adduction      Hip internal rotation      Hip external rotation      Knee flexion      Knee extension      Ankle dorsiflexion      Ankle plantarflexion      Ankle inversion      Ankle eversion       (Blank rows = not tested)   LOWER EXTREMITY MMT:     MMT Right eval Left eval  Hip flexion 5/5 5/5 min pain   Hip extension 4/5 pain 4/5 pain   Hip abduction 4/5 4/5  Hip adduction      Hip internal rotation      Hip external rotation      Knee flexion 5/5 5/5  Knee extension 5/5 5/5  Ankle dorsiflexion 5/5 5/5  Ankle plantarflexion      Ankle inversion      Ankle eversion       (Blank rows = not tested)   LUMBAR SPECIAL TESTS:  Straight leg raise test: pain end range , Slump test: Positive, and Single leg stance test: Negative   FUNCTIONAL TESTS:  NT   GAIT: Distance walked: WFL, 150 feet Assistive device utilized:  Level of assistance: NoneComplete Independence Comments: NT    TODAY'S TREATMENT:  [facility] [place] Adult PT Treatment:                                                DATE: 3/24 Therapeutic Exercise: TrA with clam, march and heel slide - mod cues for self palpation, breathing and neutral spine - added ball under sacrum for clam and march to challenge  PPT - Neutral- APT PPT to bridge x 10  Green band clam     DATE: 08/14/22  PT eval, discussed disc pressure with standing vs sitting, posture, squatting vs hinging and mobility/stability in spine      PATIENT EDUCATION:  Education details: see just above . Given printout of Lumbar stabilization: TrA, multifidus and pelvic floor  Person educated:  Patient Education method: Explanation, Demonstration, and Handouts Education comprehension: verbalized understanding, returned demonstration, and needs further education   HOME EXERCISE PROGRAM: L stab I  TrA with heel slide, march and clam (not in Med [facility])  Access Code: [email or web address] URL: [email or web address] Date: 08/28/2022 Prepared by: Hessie Diener  Exercises - Hooklying Clamshell with Resistance  - 2 x daily - 7 x weekly - 2 sets - 10 reps - 5 hold - Supine Bridge with Spinal Articulation  - 1 x daily - 7 x weekly - 2 sets - 10 reps   ASSESSMENT:   CLINICAL IMPRESSION: Patient is a 27 y.o. female who was seen today for physical therapy treatment for low back pain with lumbar radiculopathy in L4-L5 distrubution. Her MRI did show DDD and arthritis. MD recommends injections in spine due to disc space narrowing. Will discuss options on March 15 th with MD. Today spent with review and progression of HEP using stability ball and beginning bridge. Pt tolerated session well with min reports of pain. Updated HEP.         OBJECTIVE IMPAIRMENTS: decreased mobility, difficulty walking, decreased ROM, decreased strength, increased fascial restrictions, impaired flexibility, improper body mechanics, postural dysfunction, pain, and hypermobility .    ACTIVITY LIMITATIONS: carrying, lifting, bending, sitting, standing, squatting, sleeping, locomotion level, and caring for others   PARTICIPATION LIMITATIONS: laundry, interpersonal relationship, shopping, community activity, and occupation   PERSONAL FACTORS: Time since onset of injury/illness/exacerbation and 1-2 comorbidities: Prior abdominal surgery, chronic pain  are also affecting patient's functional outcome.    REHAB POTENTIAL: Excellent   CLINICAL DECISION MAKING: Stable/uncomplicated   EVALUATION COMPLEXITY: Low     GOALS: Goals reviewed with patient? Yes       LONG TERM GOALS: Target date: 09/26/2022                 Patient will be independent in home exercise program for core stability Baseline: Goal status: INITIAL   2.  Patient will understand and demonstrate proper posture and lifting in order to reduce and prevent back pain  Baseline:  Goal status: INITIAL   3.  Patient will report 25% less leg symptoms with ADLs (pain centralization) Baseline:  Goal status: INITIAL   4.  Patient will be able to report no increased pain with sitting 30 minutes or more in order to drive Baseline:  Goal status: INITIAL   5.  Patient will be able to stand and walk without limitation of pain for 30 minutes for shopping, community mobility. Baseline:  Goal status: INITIAL   3.  FOTO score will improve to 76% Baseline: 56% Goal status: INITIAL  PLAN:   PT FREQUENCY: 1x/week   PT DURATION: 8 weeks   PLANNED INTERVENTIONS: Therapeutic exercises, Therapeutic activity, Neuromuscular re-education, Balance training, Gait training, Patient/Family education, Self Care, Joint mobilization, Electrical stimulation, Spinal mobilization, Cryotherapy, Moist heat, Taping, Manual therapy, and Re-evaluation.   PLAN FOR NEXT SESSION: check HEP, core stab.  Posture, lifting    Hessie Diener, PTA 08/28/22 3:27 PM Phone: [phone] Fax: [phone]

## 2022-08-30 ENCOUNTER — Ambulatory Visit: Payer: 59 | Admitting: Physical Therapy

## 2022-08-30 ENCOUNTER — Encounter: Payer: Self-pay | Admitting: Physical Therapy

## 2022-08-30 DIAGNOSIS — M5459 Other low back pain: Secondary | ICD-10-CM

## 2022-08-30 DIAGNOSIS — M2530 Other instability, unspecified joint: Secondary | ICD-10-CM

## 2022-08-30 DIAGNOSIS — M5416 Radiculopathy, lumbar region: Secondary | ICD-10-CM

## 2022-08-30 NOTE — Therapy (Signed)
OUTPATIENT PHYSICAL THERAPY TREATMENT NOTE   [name] MRN: [id number] DOB:[birth year], 27 y.o., female Today's Date: 08/30/2022  PCP: Daiva Eves MD    REFERRING PROVIDER: Eleonore Chiquito NP  END OF SESSION:   PT End of Session - 08/30/22 1403     Visit Number 3    Number of Visits 12    Date for PT Re-Evaluation 09/25/22    Authorization Type UHC , MCD [facility]    PT Start Time 0202    PT Stop Time 0245    PT Time Calculation (min) 43 min             Past Medical History:  Diagnosis Date   Abdominal pain, acute, right lower quadrant 02/11/2012    Possible Appendix perforation, contained perforation, typhlitis, or Crohn's dz.     Anxiety    Bipolar depression ([place])    Depression    IBS (irritable bowel syndrome)    Past Surgical History:  Procedure Laterality Date   APPENDECTOMY     COLONOSCOPY     LAPAROSCOPY  06/02/2012   Procedure: LAPAROSCOPY DIAGNOSTIC;  Surgeon: Gwenyth Ober, MD;  Location: [place];  Service: General;  Laterality: N/A;   LAPAROTOMY  06/02/2012   Procedure: EXPLORATORY LAPAROTOMY;  Surgeon: Gwenyth Ober, MD;  Location: [place];  Service: General;  Laterality: N/A;   LASIK     PARTIAL COLECTOMY  06/02/2012   Procedure: PARTIAL COLECTOMY;  Surgeon: Gwenyth Ober, MD;  Location: [place];  Service: General;  Laterality: Right;   Patient Active Problem List   Diagnosis Date Noted   Pre-eclampsia 10/14/2020   Preeclampsia 10/13/2020   Gestational diabetes mellitus (GDM), antepartum 09/21/2020   Normal labor 09/28/2018   SVD (spontaneous vaginal delivery) 09/28/2018   Postop check 06/24/2012   Chronic appendicitis 04/15/2012   Acute renal insufficiency 04/08/2012   Typhlitis 02/26/2012   Abdominal pain, acute, right lower quadrant 02/11/2012   Bipolar depression ([place])     REFERRING DIAG: lumbar radiculopathy  THERAPY DIAG:  Other low back pain  Radiculopathy, lumbar region  Joint instability  Rationale for  Evaluation and Treatment Rehabilitation  PERTINENT HISTORY:  GI issues, colonecomy,see above   PRECAUTIONS: None   WEIGHT BEARING RESTRICTIONS: No  SUBJECTIVE:                                                                                                                                                                                      SUBJECTIVE STATEMENT:  Back 2/10. I had a little discomfort after last session but it did not last long.     PAIN:  Are you having  pain? Yes: NPRS scale: 2/10 Pain location: back  Pain description: sharp stabbing pain in back, LE burns  Aggravating factors: is aleady hurting she has increased pain  Relieving factors: leaning, varies standing vs sitting, not lying down, prone better than supine    Pain last night she was in tears, 7/10-8/10. She was cooking and standing. She has been unable to relate the pain to activity.    OBJECTIVE: (objective measures completed at initial evaluation unless otherwise dated)   DIAGNOSTIC FINDINGS:  Pending 08/15/22   PATIENT SURVEYS:  FOTO 56% to 76% goal    SCREENING FOR RED FLAGS: Bowel or bladder incontinence: No Spinal tumors: No Cauda equina syndrome: No Compression fracture: No  Abdominal aneurysm: No   COGNITION: Overall cognitive status: Within functional limits for tasks assessed                          SENSATION: Normal other than radicular sx but none currently    MUSCLE LENGTH: Hamstrings: WNL about 50-55 deg  Thomas test: WNL    POSTURE: rounded shoulders, forward head, and increased thoracic kyphosis, sway back    PALPATION: Hypermobile throughout her thoracic and lumbar spine Pain at T6-T7 and down to sacrum centrally No  TTP in gluteals bilaterally or hamstrings in prone    LUMBAR ROM:    AROM eval  Flexion WFL pain in legs   Extension WFL with pain central pain in spine L4-L5  Right lateral flexion WFL with pain L   Left lateral flexion WFL with pain R   Right  rotation WFL no pain   Left rotation WFL no pain    (Blank rows = not tested)   LOWER EXTREMITY ROM:   WFL    Active  Right eval Left eval  Hip flexion      Hip extension      Hip abduction      Hip adduction      Hip internal rotation      Hip external rotation      Knee flexion      Knee extension      Ankle dorsiflexion      Ankle plantarflexion      Ankle inversion      Ankle eversion       (Blank rows = not tested)   LOWER EXTREMITY MMT:     MMT Right eval Left eval  Hip flexion 5/5 5/5 min pain   Hip extension 4/5 pain 4/5 pain   Hip abduction 4/5 4/5  Hip adduction      Hip internal rotation      Hip external rotation      Knee flexion 5/5 5/5  Knee extension 5/5 5/5  Ankle dorsiflexion 5/5 5/5  Ankle plantarflexion      Ankle inversion      Ankle eversion       (Blank rows = not tested)   LUMBAR SPECIAL TESTS:  Straight leg raise test: pain end range , Slump test: Positive, and Single leg stance test: Negative   FUNCTIONAL TESTS:  NT   GAIT: Distance walked: WFL, 150 feet Assistive device utilized:  Level of assistance: NoneComplete Independence Comments: NT    TODAY'S TREATMENT:  [facility] Adult PT Treatment:                                                DATE: 08/30/22 Therapeutic Exercise: TrA with clam, march and heel slide , SLR x 10 each  PPT to bridge  Green band clam Banded bridge Green Seated hip hinge with dowel x 10 Sit-stand with dowel x 10 Sit- stand x 10      [facility] Adult PT Treatment:                                                DATE: 3/24 Therapeutic Exercise: TrA with clam, march and heel slide - mod cues for self palpation, breathing and neutral spine - added ball under sacrum for clam and march to challenge  PPT - Neutral- APT PPT to bridge x 10  Green band clam      DATE: 08/14/22  PT eval,  discussed disc pressure with standing vs sitting, posture, squatting vs hinging and mobility/stability in spine      PATIENT EDUCATION:  Education details: see just above . Given printout of Lumbar stabilization: TrA, multifidus and pelvic floor  Person educated: Patient Education method: Explanation, Demonstration, and Handouts Education comprehension: verbalized understanding, returned demonstration, and needs further education   HOME EXERCISE PROGRAM: L stab I  TrA with heel slide, march and clam (not in Med Bridge)  Access Code: [email or web address] URL: [email or web address] Date: 08/28/2022 Prepared by: Hessie Diener  Exercises - Hooklying Clamshell with Resistance  - 2 x daily - 7 x weekly - 2 sets - 10 reps - 5 hold - Supine Bridge with Spinal Articulation  - 1 x daily - 7 x weekly - 2 sets - 10 reps 08/30/22 - Sit-to -stand hip hinge with dowel  - 1 x daily - 7 x weekly - 2 sets - 10 reps   ASSESSMENT:   CLINICAL IMPRESSION: Patient is a 27 y.o. female who was seen today for physical therapy treatment for low back pain with lumbar radiculopathy in L4-L5 distrubution. Her MRI did show DDD and arthritis. MD recommends injections in spine due to disc space narrowing. Will discuss options on March 15 th with MD. Today spent with lumbar stab and posture/lifting education. Dowel along spine for feedback was helpful. Updated HEP. Pt tolerated session well.         OBJECTIVE IMPAIRMENTS: decreased mobility, difficulty walking, decreased ROM, decreased strength, increased fascial restrictions, impaired flexibility, improper body mechanics, postural dysfunction, pain, and hypermobility .    ACTIVITY LIMITATIONS: carrying, lifting, bending, sitting, standing, squatting, sleeping, locomotion level, and caring for others   PARTICIPATION LIMITATIONS: laundry, interpersonal relationship, shopping, community activity, and occupation   PERSONAL FACTORS: Time since onset of  injury/illness/exacerbation and 1-2 comorbidities: Prior abdominal surgery, chronic pain  are also affecting patient's functional outcome.    REHAB POTENTIAL: Excellent   CLINICAL DECISION MAKING: Stable/uncomplicated   EVALUATION COMPLEXITY: Low     GOALS: Goals reviewed with patient? Yes       LONG TERM GOALS: Target date: 09/26/2022                Patient will be independent in home exercise program for core stability Baseline: Goal status:  INITIAL   2.  Patient will understand and demonstrate proper posture and lifting in order to reduce and prevent back pain  Baseline:  Goal status: INITIAL   3.  Patient will report 25% less leg symptoms with ADLs (pain centralization) Baseline:  Goal status: INITIAL   4.  Patient will be able to report no increased pain with sitting 30 minutes or more in order to drive Baseline:  Goal status: INITIAL   5.  Patient will be able to stand and walk without limitation of pain for 30 minutes for shopping, community mobility. Baseline:  Goal status: INITIAL   3.  FOTO score will improve to 76% Baseline: 56% Goal status: INITIAL       PLAN:   PT FREQUENCY: 1x/week   PT DURATION: 8 weeks   PLANNED INTERVENTIONS: Therapeutic exercises, Therapeutic activity, Neuromuscular re-education, Balance training, Gait training, Patient/Family education, Self Care, Joint mobilization, Electrical stimulation, Spinal mobilization, Cryotherapy, Moist heat, Taping, Manual therapy, and Re-evaluation.   PLAN FOR NEXT SESSION: check HEP, core stab.  Posture, lifting    Hessie Diener, PTA 08/30/22 3:35 PM Phone: [phone] Fax: [phone]

## 2022-09-04 ENCOUNTER — Ambulatory Visit: Payer: 59 | Admitting: Physical Therapy

## 2022-09-04 ENCOUNTER — Encounter: Payer: Self-pay | Admitting: Physical Therapy

## 2022-09-04 DIAGNOSIS — M2530 Other instability, unspecified joint: Secondary | ICD-10-CM

## 2022-09-04 DIAGNOSIS — M5459 Other low back pain: Secondary | ICD-10-CM

## 2022-09-04 DIAGNOSIS — M5416 Radiculopathy, lumbar region: Secondary | ICD-10-CM

## 2022-09-04 NOTE — Therapy (Signed)
OUTPATIENT PHYSICAL THERAPY TREATMENT NOTE   [name] MRN: [id number] DOB:1995/06/29, 27 y.o., female Today's Date: 09/04/2022  PCP: Daiva Eves MD    REFERRING PROVIDER: Eleonore Chiquito NP  END OF SESSION:   PT End of Session - 09/04/22 0852     Visit Number 4    Number of Visits 12    Date for PT Re-Evaluation 09/25/22    Authorization Type UHC , MCD [employer]    PT Start Time 0848    PT Stop Time 0930    PT Time Calculation (min) 42 min    Activity Tolerance Patient tolerated treatment well    Behavior During Therapy [facility] for tasks assessed/performed             Past Medical History:  Diagnosis Date   Abdominal pain, acute, right lower quadrant 02/11/2012    Possible Appendix perforation, contained perforation, typhlitis, or Crohn's dz.     Anxiety    Bipolar depression ([place])    Depression    IBS (irritable bowel syndrome)    Past Surgical History:  Procedure Laterality Date   APPENDECTOMY     COLONOSCOPY     LAPAROSCOPY  06/02/2012   Procedure: LAPAROSCOPY DIAGNOSTIC;  Surgeon: Gwenyth Ober, MD;  Location: [place];  Service: General;  Laterality: N/A;   LAPAROTOMY  06/02/2012   Procedure: EXPLORATORY LAPAROTOMY;  Surgeon: Gwenyth Ober, MD;  Location: [place];  Service: General;  Laterality: N/A;   LASIK     PARTIAL COLECTOMY  06/02/2012   Procedure: PARTIAL COLECTOMY;  Surgeon: Gwenyth Ober, MD;  Location: [place];  Service: General;  Laterality: Right;   Patient Active Problem List   Diagnosis Date Noted   Pre-eclampsia 10/14/2020   Preeclampsia 10/13/2020   Gestational diabetes mellitus (GDM), antepartum 09/21/2020   Normal labor 09/28/2018   SVD (spontaneous vaginal delivery) 09/28/2018   Postop check 06/24/2012   Chronic appendicitis 04/15/2012   Acute renal insufficiency 04/08/2012   Typhlitis 02/26/2012   Abdominal pain, acute, right lower quadrant 02/11/2012   Bipolar depression ([place])     REFERRING DIAG: lumbar  radiculopathy  THERAPY DIAG:  Other low back pain  Radiculopathy, lumbar region  Joint instability  Rationale for Evaluation and Treatment Rehabilitation  PERTINENT HISTORY:  GI issues, colonecomy,see above   PRECAUTIONS: None   WEIGHT BEARING RESTRICTIONS: No  SUBJECTIVE:                                                                                                                                                                                      SUBJECTIVE STATEMENT: Back is feeling better.  I can  tell the big curve in my low back is getting better.  She will be getting an injection April 8th.  She has had less leg pain.     PAIN:  Are you having pain? Yes: NPRS scale: 2/10 Pain location: back  Pain description: sharp stabbing pain in back, LE burns  Aggravating factors: is aleady hurting she has increased pain  Relieving factors: leaning, varies standing vs sitting, not lying down, prone better than supine    Pain last night she was in tears, 7/10-8/10. She was cooking and standing. She has been unable to relate the pain to activity.    OBJECTIVE: (objective measures completed at initial evaluation unless otherwise dated)   DIAGNOSTIC FINDINGS:  Pending 08/15/22   PATIENT SURVEYS:  FOTO 56% to 76% goal    SCREENING FOR RED FLAGS: Bowel or bladder incontinence: No Spinal tumors: No Cauda equina syndrome: No Compression fracture: No  Abdominal aneurysm: No   COGNITION: Overall cognitive status: Within functional limits for tasks assessed                          SENSATION: Normal other than radicular sx but none currently    MUSCLE LENGTH: Hamstrings: WNL about 50-55 deg  Thomas test: WNL    POSTURE: rounded shoulders, forward head, and increased thoracic kyphosis, sway back    PALPATION: Hypermobile throughout her thoracic and lumbar spine Pain at T6-T7 and down to sacrum centrally No  TTP in gluteals bilaterally or hamstrings in prone    LUMBAR  ROM:    AROM eval  Flexion WFL pain in legs   Extension WFL with pain central pain in spine L4-L5  Right lateral flexion WFL with pain L   Left lateral flexion WFL with pain R   Right rotation WFL no pain   Left rotation WFL no pain    (Blank rows = not tested)   LOWER EXTREMITY ROM:   WFL    Active  Right eval Left eval  Hip flexion      Hip extension      Hip abduction      Hip adduction      Hip internal rotation      Hip external rotation      Knee flexion      Knee extension      Ankle dorsiflexion      Ankle plantarflexion      Ankle inversion      Ankle eversion       (Blank rows = not tested)   LOWER EXTREMITY MMT:     MMT Right eval Left eval  Hip flexion 5/5 5/5 min pain   Hip extension 4/5 pain 4/5 pain   Hip abduction 4/5 4/5  Hip adduction      Hip internal rotation      Hip external rotation      Knee flexion 5/5 5/5  Knee extension 5/5 5/5  Ankle dorsiflexion 5/5 5/5  Ankle plantarflexion      Ankle inversion      Ankle eversion       (Blank rows = not tested)   LUMBAR SPECIAL TESTS:  Straight leg raise test: pain end range , Slump test: Positive, and Single leg stance test: Negative   FUNCTIONAL TESTS:  NT   GAIT: Distance walked: WFL, 150 feet Assistive device utilized:  Level of assistance: NoneComplete Independence Comments: NT    TODAY'S TREATMENT:  [facility] Adult PT Treatment:                                                DATE: 09/04/22 Therapeutic Exercise: Recumbent bike L 3 for 5 min  Anterior/post tilt on soft Pilates ball and for progression  Single leg bent knee fall out alternating  March x 10  SLR x 10  90/90 hold 30 sec  Reverse toe taps  Bridge with ball x 10  Sit to stand 10 lbs  Squat x 15 with 10 lbs  Dead lift x 15  HEP new: 90/90 and toe taps, no ball under pelvis                                                                                                                      [facility] Adult PT  Treatment:                                                DATE: 08/30/22 Therapeutic Exercise: TrA with clam, march and heel slide , SLR x 10 each  PPT to bridge  Green band clam Banded bridge Green Seated hip hinge with dowel x 10 Sit-stand with dowel x 10 Sit- stand x 10   [facility] Adult PT Treatment:                                                DATE: 3/24 Therapeutic Exercise: TrA with clam, march and heel slide - mod cues for self palpation, breathing and neutral spine - added ball under sacrum for clam and march to challenge  PPT - Neutral- APT PPT to bridge x 10  Green band clam    DATE: 08/14/22  PT eval, discussed disc pressure with standing vs sitting, posture, squatting vs hinging and mobility/stability in spine      PATIENT EDUCATION:  Education details: see just above . Given printout of Lumbar stabilization: TrA, multifidus and pelvic floor  Person educated: Patient Education method: Explanation, Demonstration, and Handouts Education comprehension: verbalized understanding, returned demonstration, and needs further education   HOME EXERCISE PROGRAM: L stab I  TrA with heel slide, march and clam (not in Med Bridge)  Access Code: [email or web address] URL: [email or web address] Date: 09/04/2022 Prepared by: Raeford Razor  Exercises - Hooklying Clamshell with Resistance  - 2 x daily - 7 x weekly - 2 sets - 10 reps - 5 hold - Supine Bridge with Spinal Articulation  - 1 x daily - 7 x weekly - 2 sets - 10 reps - Sit-to -stand hip hinge with dowel  - 1 x daily -  7 x weekly - 2 sets - 10 reps - Supine March with Posterior Pelvic Tilt  - 1 x daily - 7 x weekly - 2 sets - 10 reps - Supine 90/90 Abdominal Bracing  - 1 x daily - 7 x weekly - 1 sets - 3 reps - 30 hold - Supine 90/90 Alternating Heel Touches with Posterior Pelvic Tilt  - 1 x daily - 7 x weekly - 2 sets - 10 reps - 5 hold  ASSESSMENT:   CLINICAL IMPRESSION: Patient is seeing progress with her pain and  function.  She is more aware of her posture and body mechanics when lifting, carrying her kids.  She will benefit from continues lower ab, core strength/stability to improve her function.  Min increase in back pain ("tenderness") after session today.    OBJECTIVE IMPAIRMENTS: decreased mobility, difficulty walking, decreased ROM, decreased strength, increased fascial restrictions, impaired flexibility, improper body mechanics, postural dysfunction, pain, and hypermobility .    ACTIVITY LIMITATIONS: carrying, lifting, bending, sitting, standing, squatting, sleeping, locomotion level, and caring for others   PARTICIPATION LIMITATIONS: laundry, interpersonal relationship, shopping, community activity, and occupation   PERSONAL FACTORS: Time since onset of injury/illness/exacerbation and 1-2 comorbidities: Prior abdominal surgery, chronic pain  are also affecting patient's functional outcome.    REHAB POTENTIAL: Excellent   CLINICAL DECISION MAKING: Stable/uncomplicated   EVALUATION COMPLEXITY: Low     GOALS: Goals reviewed with patient? Yes       LONG TERM GOALS: Target date: 09/26/2022                Patient will be independent in home exercise program for core stability Baseline: Goal status: met    2.  Patient will understand and demonstrate proper posture and lifting in order to reduce and prevent back pain  Baseline:  Goal status: met   3.  Patient will report 25% less leg symptoms with ADLs (pain centralization) Baseline:  Goal status: met    4.  Patient will be able to report no increased pain with sitting 30 minutes or more in order to drive Baseline:  Goal status: INITIAL   5.  Patient will be able to stand and walk without limitation of pain for 30 minutes for shopping, community mobility. Baseline:  Goal status: INITIAL   3.  FOTO score will improve to 76% Baseline: 56% Goal status: INITIAL       PLAN:   PT FREQUENCY: 1x/week   PT DURATION: 8 weeks    PLANNED INTERVENTIONS: Therapeutic exercises, Therapeutic activity, Neuromuscular re-education, Balance training, Gait training, Patient/Family education, Self Care, Joint mobilization, Electrical stimulation, Spinal mobilization, Cryotherapy, Moist heat, Taping, Manual therapy, and Re-evaluation.   PLAN FOR NEXT SESSION: check NEW HEP, core stab.  Posture, lifting   Raeford Razor, PT 09/04/22 9:51 AM Phone: [phone] Fax: [phone]

## 2022-09-06 ENCOUNTER — Encounter: Payer: Self-pay | Admitting: Physical Therapy

## 2022-09-06 ENCOUNTER — Ambulatory Visit: Payer: 59 | Admitting: Physical Therapy

## 2022-09-06 DIAGNOSIS — M5459 Other low back pain: Secondary | ICD-10-CM | POA: Diagnosis not present

## 2022-09-06 DIAGNOSIS — M2530 Other instability, unspecified joint: Secondary | ICD-10-CM

## 2022-09-06 DIAGNOSIS — M5416 Radiculopathy, lumbar region: Secondary | ICD-10-CM

## 2022-09-06 NOTE — Patient Instructions (Signed)

## 2022-09-06 NOTE — Therapy (Signed)
OUTPATIENT PHYSICAL THERAPY TREATMENT NOTE   [name] MRN: [id number] DOB:07/06/1995, 27 y.o., female Today's Date: 09/06/2022  PCP: Daiva Eves MD    REFERRING PROVIDER: Eleonore Chiquito NP  END OF SESSION:   PT End of Session - 09/06/22 0848     Visit Number 5    Number of Visits 12    Date for PT Re-Evaluation 09/25/22    Authorization Type UHC , MCD [facility]    PT Start Time [phone]    PT Stop Time 0943    PT Time Calculation (min) 54 min    Activity Tolerance Patient tolerated treatment well    Behavior During Therapy [facility] for tasks assessed/performed              Past Medical History:  Diagnosis Date   Abdominal pain, acute, right lower quadrant 02/11/2012    Possible Appendix perforation, contained perforation, typhlitis, or Crohn's dz.     Anxiety    Bipolar depression ([place])    Depression    IBS (irritable bowel syndrome)    Past Surgical History:  Procedure Laterality Date   APPENDECTOMY     COLONOSCOPY     LAPAROSCOPY  06/02/2012   Procedure: LAPAROSCOPY DIAGNOSTIC;  Surgeon: Gwenyth Ober, MD;  Location: [place];  Service: General;  Laterality: N/A;   LAPAROTOMY  06/02/2012   Procedure: EXPLORATORY LAPAROTOMY;  Surgeon: Gwenyth Ober, MD;  Location: [place];  Service: General;  Laterality: N/A;   LASIK     PARTIAL COLECTOMY  06/02/2012   Procedure: PARTIAL COLECTOMY;  Surgeon: Gwenyth Ober, MD;  Location: [place];  Service: General;  Laterality: Right;   Patient Active Problem List   Diagnosis Date Noted   Pre-eclampsia 10/14/2020   Preeclampsia 10/13/2020   Gestational diabetes mellitus (GDM), antepartum 09/21/2020   Normal labor 09/28/2018   SVD (spontaneous vaginal delivery) 09/28/2018   Postop check 06/24/2012   Chronic appendicitis 04/15/2012   Acute renal insufficiency 04/08/2012   Typhlitis 02/26/2012   Abdominal pain, acute, right lower quadrant 02/11/2012   Bipolar depression ([place])     REFERRING DIAG: lumbar  radiculopathy  THERAPY DIAG:  Other low back pain  Radiculopathy, lumbar region  Joint instability  Rationale for Evaluation and Treatment Rehabilitation  PERTINENT HISTORY:  GI issues, colonecomy,see above   PRECAUTIONS: None   WEIGHT BEARING RESTRICTIONS: No  SUBJECTIVE:                                                                                                                                                                                      SUBJECTIVE STATEMENT: Last night was really bad.  8/10. Even 30 min in a hard chair can make it hurt. Its still tender today.      PAIN:  Are you having pain? Yes: NPRS scale: 3/10 Pain location: back  Pain description: sharp stabbing pain in back, LE burns  Aggravating factors: is aleady hurting she has increased pain  Relieving factors: leaning, varies standing vs sitting, not lying down, prone better than supine    Pain last night she was in tears, 7/10-8/10. She was cooking and standing. She has been unable to relate the pain to activity.    OBJECTIVE: (objective measures completed at initial evaluation unless otherwise dated)   DIAGNOSTIC FINDINGS:  Pending 08/15/22   PATIENT SURVEYS:  FOTO 56% to 76% goal    SCREENING FOR RED FLAGS: Bowel or bladder incontinence: No Spinal tumors: No Cauda equina syndrome: No Compression fracture: No  Abdominal aneurysm: No   COGNITION: Overall cognitive status: Within functional limits for tasks assessed                          SENSATION: Normal other than radicular sx but none currently    MUSCLE LENGTH: Hamstrings: WNL about 50-55 deg  Thomas test: WNL    POSTURE: rounded shoulders, forward head, and increased thoracic kyphosis, sway back    PALPATION: Hypermobile throughout her thoracic and lumbar spine Pain at T6-T7 and down to sacrum centrally No  TTP in gluteals bilaterally or hamstrings in prone    LUMBAR ROM:    AROM eval  Flexion WFL pain in legs    Extension WFL with pain central pain in spine L4-L5  Right lateral flexion WFL with pain L   Left lateral flexion WFL with pain R   Right rotation WFL no pain   Left rotation WFL no pain    (Blank rows = not tested)   LOWER EXTREMITY ROM:   WFL    Active  Right eval Left eval  Hip flexion      Hip extension      Hip abduction      Hip adduction      Hip internal rotation      Hip external rotation      Knee flexion      Knee extension      Ankle dorsiflexion      Ankle plantarflexion      Ankle inversion      Ankle eversion       (Blank rows = not tested)   LOWER EXTREMITY MMT:     MMT Right eval Left eval  Hip flexion 5/5 5/5 min pain   Hip extension 4/5 pain 4/5 pain   Hip abduction 4/5 4/5  Hip adduction      Hip internal rotation      Hip external rotation      Knee flexion 5/5 5/5  Knee extension 5/5 5/5  Ankle dorsiflexion 5/5 5/5  Ankle plantarflexion      Ankle inversion      Ankle eversion       (Blank rows = not tested)   LUMBAR SPECIAL TESTS:  Straight leg raise test: pain end range , Slump test: Positive, and Single leg stance test: Negative   FUNCTIONAL TESTS:  NT   GAIT: Distance walked: WFL, 150 feet Assistive device utilized:  Level of assistance: NoneComplete Independence Comments: NT    TODAY'S TREATMENT:         [facility] Adult PT Treatment:  DATE: 09/06/22 Therapeutic Exercise: Recumbent bike L2 for 5 min  Cat and camel x 8 to child's pose  Quadruped stability; UE lift, LE lift and then full bird dog  Bridging x 10, added GTB double knee fall out x 2 sets x 10  Bridge with march x 10  LTR x 10  Modalities: IFC to tolerance, 15 min with MHP in prone  Self Care: IFC, TENS unit , stability in quadruped    [facility] Adult PT Treatment:                                                DATE: 09/04/22 Therapeutic Exercise: Recumbent bike L 3 for 5 min  Anterior/post tilt on soft Pilates ball and  for progression  Single leg bent knee fall out alternating  March x 10  SLR x 10  90/90 hold 30 sec  Reverse toe taps  Bridge with ball x 10  Sit to stand 10 lbs  Squat x 15 with 10 lbs  Dead lift x 15  HEP new: 90/90 and toe taps, no ball under pelvis                                                                                                                      [facility] Adult PT Treatment:                                                DATE: 08/30/22 Therapeutic Exercise: TrA with clam, march and heel slide , SLR x 10 each  PPT to bridge  Green band clam Banded bridge Green Seated hip hinge with dowel x 10 Sit-stand with dowel x 10 Sit- stand x 10   [facility] Adult PT Treatment:                                                DATE: 3/24 Therapeutic Exercise: TrA with clam, march and heel slide - mod cues for self palpation, breathing and neutral spine - added ball under sacrum for clam and march to challenge  PPT - Neutral- APT PPT to bridge x 10  Green band clam    DATE: 08/14/22  PT eval, discussed disc pressure with standing vs sitting, posture, squatting vs hinging and mobility/stability in spine      PATIENT EDUCATION:  Education details: see just above . Given printout of Lumbar stabilization: TrA, multifidus and pelvic floor  Person educated: Patient Education method: Explanation, Demonstration, and Handouts Education comprehension: verbalized understanding, returned demonstration, and needs further education   HOME EXERCISE PROGRAM: L stab I  TrA  with heel slide, march and clam (not in [facility] [place])  Access Code: [email or web address] URL: [email or web address] Date: 09/04/2022 Prepared by: Raeford Razor  Exercises - Hooklying Clamshell with Resistance  - 2 x daily - 7 x weekly - 2 sets - 10 reps - 5 hold - Supine Bridge with Spinal Articulation  - 1 x daily - 7 x weekly - 2 sets - 10 reps - Sit-to -stand hip hinge with dowel  - 1 x daily - 7 x weekly - 2 sets - 10  reps - Supine March with Posterior Pelvic Tilt  - 1 x daily - 7 x weekly - 2 sets - 10 reps - Supine 90/90 Abdominal Bracing  - 1 x daily - 7 x weekly - 1 sets - 3 reps - 30 hold - Supine 90/90 Alternating Heel Touches with Posterior Pelvic Tilt  - 1 x daily - 7 x weekly - 2 sets - 10 reps - 5 hold  ASSESSMENT:   CLINICAL IMPRESSION: Patient reporting increased pain in central low back last night, without specific incident.  She does think her daily interactions with the kids may contribute (lifting, carrying kids around) . Focs on stabilization today and also introduced modalities today.  Trial of E-stim.   OBJECTIVE IMPAIRMENTS: decreased mobility, difficulty walking, decreased ROM, decreased strength, increased fascial restrictions, impaired flexibility, improper body mechanics, postural dysfunction, pain, and hypermobility .    ACTIVITY LIMITATIONS: carrying, lifting, bending, sitting, standing, squatting, sleeping, locomotion level, and caring for others   PARTICIPATION LIMITATIONS: laundry, interpersonal relationship, shopping, community activity, and occupation   PERSONAL FACTORS: Time since onset of injury/illness/exacerbation and 1-2 comorbidities: Prior abdominal surgery, chronic pain  are also affecting patient's functional outcome.    REHAB POTENTIAL: Excellent   CLINICAL DECISION MAKING: Stable/uncomplicated   EVALUATION COMPLEXITY: Low     GOALS: Goals reviewed with patient? Yes       LONG TERM GOALS: Target date: 09/26/2022                Patient will be independent in home exercise program for core stability Baseline: Goal status: met    2.  Patient will understand and demonstrate proper posture and lifting in order to reduce and prevent back pain  Baseline:  Goal status: met   3.  Patient will report 25% less leg symptoms with ADLs (pain centralization) Baseline:  Goal status: met    4.  Patient will be able to report no increased pain with sitting 30  minutes or more in order to drive Baseline:  Goal status: INITIAL   5.  Patient will be able to stand and walk without limitation of pain for 30 minutes for shopping, community mobility. Baseline:  Goal status: INITIAL   3.  FOTO score will improve to 76% Baseline: 56% Goal status: INITIAL       PLAN:   PT FREQUENCY: 1x/week   PT DURATION: 8 weeks   PLANNED INTERVENTIONS: Therapeutic exercises, Therapeutic activity, Neuromuscular re-education, Balance training, Gait training, Patient/Family education, Self Care, Joint mobilization, Electrical stimulation, Spinal mobilization, Cryotherapy, Moist heat, Taping, Manual therapy, and Re-evaluation.   PLAN FOR NEXT SESSION: check NEW HEP, core stab.  Posture, lifting . How was stim?   Raeford Razor, PT 09/06/22 12:29 PM Phone: [phone] Fax: [phone]

## 2022-09-10 ENCOUNTER — Ambulatory Visit: Payer: 59 | Admitting: Physical Therapy

## 2022-09-10 ENCOUNTER — Encounter: Payer: Self-pay | Admitting: Physical Therapy

## 2022-09-10 DIAGNOSIS — M5416 Radiculopathy, lumbar region: Secondary | ICD-10-CM

## 2022-09-10 DIAGNOSIS — M5459 Other low back pain: Secondary | ICD-10-CM

## 2022-09-10 DIAGNOSIS — M2530 Other instability, unspecified joint: Secondary | ICD-10-CM

## 2022-09-10 NOTE — Therapy (Unsigned)
OUTPATIENT PHYSICAL THERAPY TREATMENT NOTE   [name] MRN: [id number] DOB:[birth year], 27 y.o., female Today's Date: 09/11/2022  PCP: Daiva Eves MD    REFERRING PROVIDER: Eleonore Chiquito NP  END OF SESSION:   PT End of Session - 09/11/22 0843     Visit Number 7    Number of Visits 12    Date for PT Re-Evaluation 09/25/22    Authorization Type UHC , MCD [facility]    PT Start Time [phone]    PT Stop Time 0930    PT Time Calculation (min) 49 min    Activity Tolerance Patient tolerated treatment well    Behavior During Therapy [facility] for tasks assessed/performed               Past Medical History:  Diagnosis Date   Abdominal pain, acute, right lower quadrant 02/11/2012    Possible Appendix perforation, contained perforation, typhlitis, or Crohn's dz.     Anxiety    Bipolar depression ([place])    Depression    IBS (irritable bowel syndrome)    Past Surgical History:  Procedure Laterality Date   APPENDECTOMY     COLONOSCOPY     LAPAROSCOPY  06/02/2012   Procedure: LAPAROSCOPY DIAGNOSTIC;  Surgeon: Gwenyth Ober, MD;  Location: [place];  Service: General;  Laterality: N/A;   LAPAROTOMY  06/02/2012   Procedure: EXPLORATORY LAPAROTOMY;  Surgeon: Gwenyth Ober, MD;  Location: [place];  Service: General;  Laterality: N/A;   LASIK     PARTIAL COLECTOMY  06/02/2012   Procedure: PARTIAL COLECTOMY;  Surgeon: Gwenyth Ober, MD;  Location: [place];  Service: General;  Laterality: Right;   Patient Active Problem List   Diagnosis Date Noted   Pre-eclampsia 10/14/2020   Preeclampsia 10/13/2020   Gestational diabetes mellitus (GDM), antepartum 09/21/2020   Normal labor 09/28/2018   SVD (spontaneous vaginal delivery) 09/28/2018   Postop check 06/24/2012   Chronic appendicitis 04/15/2012   Acute renal insufficiency 04/08/2012   Typhlitis 02/26/2012   Abdominal pain, acute, right lower quadrant 02/11/2012   Bipolar depression ([place])     REFERRING DIAG: lumbar  radiculopathy  THERAPY DIAG:  Other low back pain  Radiculopathy, lumbar region  Joint instability  Rationale for Evaluation and Treatment Rehabilitation  PERTINENT HISTORY:  GI issues, colonecomy,see above   PRECAUTIONS: None   WEIGHT BEARING RESTRICTIONS: No  SUBJECTIVE:                                                                                                                                                                                      SUBJECTIVE STATEMENT: I am good today, was  a little sore after yesterday's visit.  No pain right now.      PAIN:  Are you having pain? Yes: NPRS scale: none  Pain location: back  Pain description: sharp stabbing pain in back, LE burns  Aggravating factors: is aleady hurting she has increased pain  Relieving factors: leaning, varies standing vs sitting, not lying down, prone better than supine    Pain last night she was in tears, 7/10-8/10. She was cooking and standing. She has been unable to relate the pain to activity.    OBJECTIVE: (objective measures completed at initial evaluation unless otherwise dated)   DIAGNOSTIC FINDINGS:  Pending 08/15/22   PATIENT SURVEYS:  FOTO 56% to 76% goal    SCREENING FOR RED FLAGS: Bowel or bladder incontinence: No Spinal tumors: No Cauda equina syndrome: No Compression fracture: No  Abdominal aneurysm: No   COGNITION: Overall cognitive status: Within functional limits for tasks assessed                          SENSATION: Normal other than radicular sx but none currently    MUSCLE LENGTH: Hamstrings: WNL about 50-55 deg  Thomas test: WNL    POSTURE: rounded shoulders, forward head, and increased thoracic kyphosis, sway back    PALPATION: Hypermobile throughout her thoracic and lumbar spine Pain at T6-T7 and down to sacrum centrally No  TTP in gluteals bilaterally or hamstrings in prone    LUMBAR ROM:    AROM eval  Flexion WFL pain in legs   Extension WFL with pain  central pain in spine L4-L5  Right lateral flexion WFL with pain L   Left lateral flexion WFL with pain R   Right rotation WFL no pain   Left rotation WFL no pain    (Blank rows = not tested)   LOWER EXTREMITY ROM:   WFL    Active  Right eval Left eval  Hip flexion      Hip extension      Hip abduction      Hip adduction      Hip internal rotation      Hip external rotation      Knee flexion      Knee extension      Ankle dorsiflexion      Ankle plantarflexion      Ankle inversion      Ankle eversion       (Blank rows = not tested)   LOWER EXTREMITY MMT:     MMT Right eval Left eval  Hip flexion 5/5 5/5 min pain   Hip extension 4/5 pain 4/5 pain   Hip abduction 4/5 4/5  Hip adduction      Hip internal rotation      Hip external rotation      Knee flexion 5/5 5/5  Knee extension 5/5 5/5  Ankle dorsiflexion 5/5 5/5  Ankle plantarflexion      Ankle inversion      Ankle eversion       (Blank rows = not tested)   LUMBAR SPECIAL TESTS:  Straight leg raise test: pain end range , Slump test: Positive, and Single leg stance test: Negative   FUNCTIONAL TESTS:  NT   GAIT: Distance walked: WFL, 150 feet Assistive device utilized:  Level of assistance: NoneComplete Independence Comments: NT    TODAY'S TREATMENT:         [facility] Adult PT Treatment:  DATE: 09/11/22 Therapeutic Exercise: Recumbent bike 5 min L1 Foam roller stability exercises  Alt UE flex/ext, horizontal abd double arm and single arm Alt march x 10  Unilateral bent knee fall out  Dead bug Forearm plank knees bent (roller) added knees in and out for lower abs Sideplank 30 sec x 2 and then small pulse for 30 sec each side  Standing core at [place] : extension and row, circles  Palloff press using slastix and then rotation away   [facility] Adult PT Treatment:                                                DATE: 09/10/22 Therapeutic  Exercise: 90/90-progressed to leg extensions Banded bridge x 10 Bridging x 10, added GTB double knee fall out x 2 sets x 10  [place]igeon stretch x 1 each 15# squat 10 x 2 15# dead lift x 15- needs cues 25% of the time  Palloff press - single blue band x 10 each way Blue band shoulder ext x 15 Plank on forearm/toes- unable to complete with good form/ also increase LBP Plank on forearm/knees- cues for PPT 20 sec     [facility] Adult PT Treatment:                                                DATE: 09/06/22 Therapeutic Exercise: Recumbent bike L2 for 5 min  Cat and camel x 8 to child's pose  Quadruped stability; UE lift, LE lift and then full bird dog  Bridging x 10, added GTB double knee fall out x 2 sets x 10  Bridge with march x 10  LTR x 10  Modalities: IFC to tolerance, 15 min with MHP in prone  Self Care: IFC, TENS unit , stability in quadruped    [facility] Adult PT Treatment:                                                DATE: 09/04/22 Therapeutic Exercise: Recumbent bike L 3 for 5 min  Anterior/post tilt on soft Pilates ball and for progression  Single leg bent knee fall out alternating  March x 10  SLR x 10  90/90 hold 30 sec  Reverse toe taps  Bridge with ball x 10  Sit to stand 10 lbs  Squat x 15 with 10 lbs  Dead lift x 15  HEP new: 90/90 and toe taps, no ball under pelvis  [facility] Adult PT Treatment:                                                DATE: 08/30/22 Therapeutic Exercise: TrA with clam, march and heel slide , SLR x 10 each  PPT to bridge  Green band clam Banded bridge Green Seated hip hinge with dowel x 10 Sit-stand with dowel x 10 Sit- stand x 10   [facility] Adult PT Treatment:                                                DATE: 3/24 Therapeutic Exercise: TrA with clam, march and heel slide - mod cues for self palpation, breathing and neutral spine -  added ball under sacrum for clam and march to challenge  PPT - Neutral- APT PPT to bridge x 10  Green band clam    DATE: 08/14/22  PT eval, discussed disc pressure with standing vs sitting, posture, squatting vs hinging and mobility/stability in spine      PATIENT EDUCATION:  Education details: see just above . Given printout of Lumbar stabilization: TrA, multifidus and pelvic floor  Person educated: Patient Education method: Explanation, Demonstration, and Handouts Education comprehension: verbalized understanding, returned demonstration, and needs further education   HOME EXERCISE PROGRAM: L stab I  TrA with heel slide, march and clam (not in [facility] [place])  Access Code: [email or web address] URL: [email or web address] Date: 09/04/2022 Prepared by: Raeford Razor  Exercises - Hooklying Clamshell with Resistance  - 2 x daily - 7 x weekly - 2 sets - 10 reps - 5 hold - Supine Bridge with Spinal Articulation  - 1 x daily - 7 x weekly - 2 sets - 10 reps - Sit-to -stand hip hinge with dowel  - 1 x daily - 7 x weekly - 2 sets - 10 reps - Supine March with Posterior Pelvic Tilt  - 1 x daily - 7 x weekly - 2 sets - 10 reps - Supine 90/90 Abdominal Bracing  - 1 x daily - 7 x weekly - 1 sets - 3 reps - 30 hold - Supine 90/90 Alternating Heel Touches with Posterior Pelvic Tilt  - 1 x daily - 7 x weekly - 2 sets - 10 reps - 5 hold  ASSESSMENT:   CLINICAL IMPRESSION:  Patient needed mod cues for  core activation in standing especially.  Tends to hyperextend through her lumbar spine with shoulder extension.  Tenses up through neck and shoulders as well.  She continues to benefit from core stability and conditioning to translate to better functional mobility.     OBJECTIVE IMPAIRMENTS: decreased mobility, difficulty walking, decreased ROM, decreased strength, increased fascial restrictions, impaired flexibility, improper body mechanics, postural dysfunction, pain, and hypermobility .     ACTIVITY LIMITATIONS: carrying, lifting, bending, sitting, standing, squatting, sleeping, locomotion level, and caring for others   PARTICIPATION LIMITATIONS: laundry, interpersonal relationship, shopping, community activity, and occupation   PERSONAL FACTORS: Time since onset of injury/illness/exacerbation and 1-2 comorbidities: Prior abdominal surgery, chronic pain  are also affecting patient's functional outcome.    REHAB POTENTIAL: Excellent   CLINICAL DECISION MAKING: Stable/uncomplicated   EVALUATION COMPLEXITY: Low     GOALS: Goals reviewed with patient? Yes  LONG TERM GOALS: Target date: 09/26/2022     Patient will be independent in home exercise program for core stability Baseline: Goal status: met    2.  Patient will understand and demonstrate proper posture and lifting in order to reduce and prevent back pain  Baseline:  Goal status: met   3.  Patient will report 25% less leg symptoms with ADLs (pain centralization) Baseline:  Goal status: met    4.  Patient will be able to report no increased pain with sitting 30 minutes or more in order to drive Baseline:  Goal status: INITIAL   5.  Patient will be able to stand and walk without limitation of pain for 30 minutes for shopping, community mobility. Baseline:  Goal status: INITIAL   3.  FOTO score will improve to 76% Baseline: 56% Goal status: INITIAL       PLAN:   PT FREQUENCY: 1x/week   PT DURATION: 8 weeks   PLANNED INTERVENTIONS: Therapeutic exercises, Therapeutic activity, Neuromuscular re-education, Balance training, Gait training, Patient/Family education, Self Care, Joint mobilization, Electrical stimulation, Spinal mobilization, Cryotherapy, Moist heat, Taping, Manual therapy, and Re-evaluation.   PLAN FOR NEXT SESSION: continue core stab.  Posture, lifting  Raeford Razor, PT 09/11/22 9:28 AM Phone: [phone] Fax: [phone]

## 2022-09-10 NOTE — Therapy (Signed)
OUTPATIENT PHYSICAL THERAPY TREATMENT NOTE   [name] MRN: [id number] DOB:[birth year], 27 y.o., female Today's Date: 09/10/2022  PCP: Daiva Eves MD    REFERRING PROVIDER: Eleonore Chiquito NP  END OF SESSION:   PT End of Session - 09/10/22 1404     Visit Number 6    Number of Visits 12    Date for PT Re-Evaluation 09/25/22    Authorization Type UHC , [employer]    PT Start Time 1400    PT Stop Time 1444    PT Time Calculation (min) 44 min              Past Medical History:  Diagnosis Date   Abdominal pain, acute, right lower quadrant 02/11/2012    Possible Appendix perforation, contained perforation, typhlitis, or Crohn's dz.     Anxiety    Bipolar depression ([place])    Depression    IBS (irritable bowel syndrome)    Past Surgical History:  Procedure Laterality Date   APPENDECTOMY     COLONOSCOPY     LAPAROSCOPY  06/02/2012   Procedure: LAPAROSCOPY DIAGNOSTIC;  Surgeon: Gwenyth Ober, MD;  Location: [place];  Service: General;  Laterality: N/A;   LAPAROTOMY  06/02/2012   Procedure: EXPLORATORY LAPAROTOMY;  Surgeon: Gwenyth Ober, MD;  Location: [place];  Service: General;  Laterality: N/A;   LASIK     PARTIAL COLECTOMY  06/02/2012   Procedure: PARTIAL COLECTOMY;  Surgeon: Gwenyth Ober, MD;  Location: [place];  Service: General;  Laterality: Right;   Patient Active Problem List   Diagnosis Date Noted   Pre-eclampsia 10/14/2020   Preeclampsia 10/13/2020   Gestational diabetes mellitus (GDM), antepartum 09/21/2020   Normal labor 09/28/2018   SVD (spontaneous vaginal delivery) 09/28/2018   Postop check 06/24/2012   Chronic appendicitis 04/15/2012   Acute renal insufficiency 04/08/2012   Typhlitis 02/26/2012   Abdominal pain, acute, right lower quadrant 02/11/2012   Bipolar depression ([place])     REFERRING DIAG: lumbar radiculopathy  THERAPY DIAG:  Other low back pain  Radiculopathy, lumbar region  Joint instability  Rationale for  Evaluation and Treatment Rehabilitation  PERTINENT HISTORY:  GI issues, colonecomy,see above   PRECAUTIONS: None   WEIGHT BEARING RESTRICTIONS: No  SUBJECTIVE:                                                                                                                                                                                      SUBJECTIVE STATEMENT: Pain from riding [place] yesterday because of all the sitting. The leg pain is not traveling as far down the leg.  PAIN:  Are you having pain? Yes: NPRS scale: 2/10, mostly but bone that hurts  Pain location: back  Pain description: sharp stabbing pain in back, LE burns  Aggravating factors: is aleady hurting she has increased pain  Relieving factors: leaning, varies standing vs sitting, not lying down, prone better than supine    Pain last night she was in tears, 7/10-8/10. She was cooking and standing. She has been unable to relate the pain to activity.    OBJECTIVE: (objective measures completed at initial evaluation unless otherwise dated)   DIAGNOSTIC FINDINGS:  Pending 08/15/22   PATIENT SURVEYS:  FOTO 56% to 76% goal    SCREENING FOR RED FLAGS: Bowel or bladder incontinence: No Spinal tumors: No Cauda equina syndrome: No Compression fracture: No  Abdominal aneurysm: No   COGNITION: Overall cognitive status: Within functional limits for tasks assessed                          SENSATION: Normal other than radicular sx but none currently    MUSCLE LENGTH: Hamstrings: WNL about 50-55 deg  Thomas test: WNL    POSTURE: rounded shoulders, forward head, and increased thoracic kyphosis, sway back    PALPATION: Hypermobile throughout her thoracic and lumbar spine Pain at T6-T7 and down to sacrum centrally No  TTP in gluteals bilaterally or hamstrings in prone    LUMBAR ROM:    AROM eval  Flexion WFL pain in legs   Extension WFL with pain central pain in spine L4-L5  Right lateral flexion WFL with  pain L   Left lateral flexion WFL with pain R   Right rotation WFL no pain   Left rotation WFL no pain    (Blank rows = not tested)   LOWER EXTREMITY ROM:   WFL    Active  Right eval Left eval  Hip flexion      Hip extension      Hip abduction      Hip adduction      Hip internal rotation      Hip external rotation      Knee flexion      Knee extension      Ankle dorsiflexion      Ankle plantarflexion      Ankle inversion      Ankle eversion       (Blank rows = not tested)   LOWER EXTREMITY MMT:     MMT Right eval Left eval  Hip flexion 5/5 5/5 min pain   Hip extension 4/5 pain 4/5 pain   Hip abduction 4/5 4/5  Hip adduction      Hip internal rotation      Hip external rotation      Knee flexion 5/5 5/5  Knee extension 5/5 5/5  Ankle dorsiflexion 5/5 5/5  Ankle plantarflexion      Ankle inversion      Ankle eversion       (Blank rows = not tested)   LUMBAR SPECIAL TESTS:  Straight leg raise test: pain end range , Slump test: Positive, and Single leg stance test: Negative   FUNCTIONAL TESTS:  NT   GAIT: Distance walked: WFL, 150 feet Assistive device utilized:  Level of assistance: NoneComplete Independence Comments: NT    TODAY'S TREATMENT:        [facility] Adult PT Treatment:  DATE: 09/10/22 Therapeutic Exercise: 90/90-progressed to leg extensions Banded bridge x 10 Bridging x 10, added GTB double knee fall out x 2 sets x 10  Pigeon stretch x 1 each 15# squat 10 x 2 15# dead lift x 15- needs cues 25% of the time  Palloff press - single blue band x 10 each way Blue band shoulder ext x 15 Plank on forearm/toes- unable to complete with good form/ also increase LBP Plank on forearm/knees- cues for PPT 20 sec     [facility] Adult PT Treatment:                                                DATE: 09/06/22 Therapeutic Exercise: Recumbent bike L2 for 5 min  Cat and camel x 8 to child's pose  Quadruped stability; UE  lift, LE lift and then full bird dog  Bridging x 10, added GTB double knee fall out x 2 sets x 10  Bridge with march x 10  LTR x 10  Modalities: IFC to tolerance, 15 min with MHP in prone  Self Care: IFC, TENS unit , stability in quadruped    [facility] Adult PT Treatment:                                                DATE: 09/04/22 Therapeutic Exercise: Recumbent bike L 3 for 5 min  Anterior/post tilt on soft Pilates ball and for progression  Single leg bent knee fall out alternating  March x 10  SLR x 10  90/90 hold 30 sec  Reverse toe taps  Bridge with ball x 10  Sit to stand 10 lbs  Squat x 15 with 10 lbs  Dead lift x 15  HEP new: 90/90 and toe taps, no ball under pelvis                                                                                                                      [facility] Adult PT Treatment:                                                DATE: 08/30/22 Therapeutic Exercise: TrA with clam, march and heel slide , SLR x 10 each  PPT to bridge  Green band clam Banded bridge Green Seated hip hinge with dowel x 10 Sit-stand with dowel x 10 Sit- stand x 10   [facility] Adult PT Treatment:  DATE: 3/24 Therapeutic Exercise: TrA with clam, march and heel slide - mod cues for self palpation, breathing and neutral spine - added ball under sacrum for clam and march to challenge  PPT - Neutral- APT PPT to bridge x 10  Green band clam    DATE: 08/14/22  PT eval, discussed disc pressure with standing vs sitting, posture, squatting vs hinging and mobility/stability in spine      PATIENT EDUCATION:  Education details: see just above . Given printout of Lumbar stabilization: TrA, multifidus and pelvic floor  Person educated: Patient Education method: Explanation, Demonstration, and Handouts Education comprehension: verbalized understanding, returned demonstration, and needs further education   HOME EXERCISE PROGRAM: L stab I   TrA with heel slide, march and clam (not in Med Bridge)  Access Code: [email or web address] URL: [email or web address] Date: 09/04/2022 Prepared by: Raeford Razor  Exercises - Hooklying Clamshell with Resistance  - 2 x daily - 7 x weekly - 2 sets - 10 reps - 5 hold - Supine Bridge with Spinal Articulation  - 1 x daily - 7 x weekly - 2 sets - 10 reps - Sit-to -stand hip hinge with dowel  - 1 x daily - 7 x weekly - 2 sets - 10 reps - Supine March with Posterior Pelvic Tilt  - 1 x daily - 7 x weekly - 2 sets - 10 reps - Supine 90/90 Abdominal Bracing  - 1 x daily - 7 x weekly - 1 sets - 3 reps - 30 hold - Supine 90/90 Alternating Heel Touches with Posterior Pelvic Tilt  - 1 x daily - 7 x weekly - 2 sets - 10 reps - 5 hold  ASSESSMENT:   CLINICAL IMPRESSION: Patient reporting increased pain after riding [employer] yesterday. She reports positive response from estim last session and is ordering herself one for home.Today her pain is low level and able to continue with core strengthening. Progressed with standing core and began plank. Better form on knees with plank. Will assess response tomorrow when she returns for second visit of the week. She does note decreasing lordosis since she has started PT as well as radicular pain more centralized to upper leg.    OBJECTIVE IMPAIRMENTS: decreased mobility, difficulty walking, decreased ROM, decreased strength, increased fascial restrictions, impaired flexibility, improper body mechanics, postural dysfunction, pain, and hypermobility .    ACTIVITY LIMITATIONS: carrying, lifting, bending, sitting, standing, squatting, sleeping, locomotion level, and caring for others   PARTICIPATION LIMITATIONS: laundry, interpersonal relationship, shopping, community activity, and occupation   PERSONAL FACTORS: Time since onset of injury/illness/exacerbation and 1-2 comorbidities: Prior abdominal surgery, chronic pain  are also affecting patient's functional outcome.     REHAB POTENTIAL: Excellent   CLINICAL DECISION MAKING: Stable/uncomplicated   EVALUATION COMPLEXITY: Low     GOALS: Goals reviewed with patient? Yes       LONG TERM GOALS: Target date: 09/26/2022                Patient will be independent in home exercise program for core stability Baseline: Goal status: met    2.  Patient will understand and demonstrate proper posture and lifting in order to reduce and prevent back pain  Baseline:  Goal status: met   3.  Patient will report 25% less leg symptoms with ADLs (pain centralization) Baseline:  Goal status: met    4.  Patient will be able to report no increased pain with sitting 30 minutes or more in order to drive Baseline:  Goal  status: INITIAL   5.  Patient will be able to stand and walk without limitation of pain for 30 minutes for shopping, community mobility. Baseline:  Goal status: INITIAL   3.  FOTO score will improve to 76% Baseline: 56% Goal status: INITIAL       PLAN:   PT FREQUENCY: 1x/week   PT DURATION: 8 weeks   PLANNED INTERVENTIONS: Therapeutic exercises, Therapeutic activity, Neuromuscular re-education, Balance training, Gait training, Patient/Family education, Self Care, Joint mobilization, Electrical stimulation, Spinal mobilization, Cryotherapy, Moist heat, Taping, Manual therapy, and Re-evaluation.   PLAN FOR NEXT SESSION: check NEW HEP, core stab.  Posture, lifting . Did she get TENS?  Hessie Diener, PTA 09/10/22 2:51 PM Phone: [phone] Fax: [phone]

## 2022-09-11 ENCOUNTER — Ambulatory Visit: Payer: 59 | Admitting: Physical Therapy

## 2022-09-11 ENCOUNTER — Encounter: Payer: Self-pay | Admitting: Physical Therapy

## 2022-09-11 DIAGNOSIS — M5459 Other low back pain: Secondary | ICD-10-CM | POA: Diagnosis not present

## 2022-09-11 DIAGNOSIS — M2530 Other instability, unspecified joint: Secondary | ICD-10-CM

## 2022-09-11 DIAGNOSIS — M5416 Radiculopathy, lumbar region: Secondary | ICD-10-CM

## 2022-09-11 NOTE — Addendum Note (Signed)
Addended by: Raeford Razor L on: 09/11/2022 09:33 AM   Modules accepted: Orders

## 2022-09-13 ENCOUNTER — Ambulatory Visit: Payer: 59 | Admitting: Physical Therapy

## 2022-09-17 NOTE — Therapy (Unsigned)
OUTPATIENT PHYSICAL THERAPY TREATMENT NOTE   [name] MRN: [id number] DOB:[birth year], 27 y.o., female Today's Date: 09/18/2022  PCP: Daiva Eves MD    REFERRING PROVIDER: Eleonore Chiquito NP  END OF SESSION:   PT End of Session - 09/18/22 0849     Visit Number 8    Number of Visits 12    Date for PT Re-Evaluation 09/25/22    Authorization Type UHC , MCD [facility]    PT Start Time 0848    PT Stop Time 0930    PT Time Calculation (min) 42 min    Activity Tolerance Patient tolerated treatment well    Behavior During Therapy [facility] for tasks assessed/performed                Past Medical History:  Diagnosis Date   Abdominal pain, acute, right lower quadrant 02/11/2012    Possible Appendix perforation, contained perforation, typhlitis, or Crohn's dz.     Anxiety    Bipolar depression ([place])    Depression    IBS (irritable bowel syndrome)    Past Surgical History:  Procedure Laterality Date   APPENDECTOMY     COLONOSCOPY     LAPAROSCOPY  06/02/2012   Procedure: LAPAROSCOPY DIAGNOSTIC;  Surgeon: Gwenyth Ober, MD;  Location: [place];  Service: General;  Laterality: N/A;   LAPAROTOMY  06/02/2012   Procedure: EXPLORATORY LAPAROTOMY;  Surgeon: Gwenyth Ober, MD;  Location: [place];  Service: General;  Laterality: N/A;   LASIK     PARTIAL COLECTOMY  06/02/2012   Procedure: PARTIAL COLECTOMY;  Surgeon: Gwenyth Ober, MD;  Location: [place];  Service: General;  Laterality: Right;   Patient Active Problem List   Diagnosis Date Noted   Pre-eclampsia 10/14/2020   Preeclampsia 10/13/2020   Gestational diabetes mellitus (GDM), antepartum 09/21/2020   Normal labor 09/28/2018   SVD (spontaneous vaginal delivery) 09/28/2018   Postop check 06/24/2012   Chronic appendicitis 04/15/2012   Acute renal insufficiency 04/08/2012   Typhlitis 02/26/2012   Abdominal pain, acute, right lower quadrant 02/11/2012   Bipolar depression ([place])     REFERRING DIAG: lumbar  radiculopathy  THERAPY DIAG:  Other low back pain  Radiculopathy, lumbar region  Joint instability  Rationale for Evaluation and Treatment Rehabilitation  PERTINENT HISTORY:  GI issues, colonecomy,see above   PRECAUTIONS: None   WEIGHT BEARING RESTRICTIONS: No  SUBJECTIVE:                                                                                                                                                                                      SUBJECTIVE STATEMENT: Back has been a  little tender.  My LLE just goes numb and is so painful.  It happened Friday.  It only last about 5-10 min and I can't put weight on it.     PAIN:  Are you having pain? Yes: NPRS scale: none  Pain location: back  Pain description: sharp stabbing pain in back, LE burns  Aggravating factors: is aleady hurting she has increased pain  Relieving factors: leaning, varies standing vs sitting, not lying down, prone better than supine    Pain last night she was in tears, 7/10-8/10. She was cooking and standing. She has been unable to relate the pain to activity.    OBJECTIVE: (objective measures completed at initial evaluation unless otherwise dated)   DIAGNOSTIC FINDINGS:  Pending 08/15/22   PATIENT SURVEYS:  FOTO 56% to 76% goal    SCREENING FOR RED FLAGS: Bowel or bladder incontinence: No Spinal tumors: No Cauda equina syndrome: No Compression fracture: No  Abdominal aneurysm: No   COGNITION: Overall cognitive status: Within functional limits for tasks assessed                          SENSATION: Normal other than radicular sx but none currently    MUSCLE LENGTH: Hamstrings: WNL about 50-55 deg  Thomas test: WNL    POSTURE: rounded shoulders, forward head, and increased thoracic kyphosis, sway back    PALPATION: Hypermobile throughout her thoracic and lumbar spine Pain at T6-T7 and down to sacrum centrally No TTP in gluteals bilaterally or hamstrings in prone    LUMBAR ROM:     AROM eval  Flexion WFL pain in legs   Extension WFL with pain central pain in spine L4-L5  Right lateral flexion WFL with pain L   Left lateral flexion WFL with pain R   Right rotation WFL no pain   Left rotation WFL no pain    (Blank rows = not tested)   LOWER EXTREMITY ROM:   WFL    Active  Right eval Left eval  Hip flexion      Hip extension      Hip abduction      Hip adduction      Hip internal rotation      Hip external rotation      Knee flexion      Knee extension      Ankle dorsiflexion      Ankle plantarflexion      Ankle inversion      Ankle eversion       (Blank rows = not tested)   LOWER EXTREMITY MMT:     MMT Right eval Left eval  Hip flexion 5/5 5/5 min pain   Hip extension 4/5 pain 4/5 pain   Hip abduction 4/5 4/5  Hip adduction      Hip internal rotation      Hip external rotation      Knee flexion 5/5 5/5  Knee extension 5/5 5/5  Ankle dorsiflexion 5/5 5/5  Ankle plantarflexion      Ankle inversion      Ankle eversion       (Blank rows = not tested)   LUMBAR SPECIAL TESTS:  Straight leg raise test: pain end range , Slump test: Positive, and Single leg stance test: Negative   FUNCTIONAL TESTS:  NT   GAIT: Distance walked: WFL, 150 feet Assistive device utilized:  Level of assistance: NoneComplete [place] Comments: NT    TODAY'S TREATMENT:  [facility] Adult PT Treatment:                                                DATE: 09/18/22 Therapeutic Exercise: Recumbent bike L2-3 for 5 min  FOTO 70% Ball squeeze x 8 Bridge x 10 with ball  Pilates Tower for LE/Core strength, postural strength, lumbopelvic disassociation and core control.   Exercises included: [place]  Supine [place] single leg arcs and circles  Double leg arcs and circles , squats [place] x 10 then with bridging x 10     [facility] Adult PT Treatment:                                                DATE: 09/11/22 Therapeutic Exercise: Recumbent  bike 5 min L1 Foam roller stability exercises  Alt UE flex/ext, horizontal abd double arm and single arm Alt march x 10  Unilateral bent knee fall out  Dead bug Forearm plank knees bent (roller) added knees in and out for lower abs Sideplank 30 sec x 2 and then small pulse for 30 sec each side  Standing core at [place] : extension and row, circles  Palloff press using slastix and then rotation away   [facility] Adult PT Treatment:                                                DATE: 09/10/22 Therapeutic Exercise: 90/90-progressed to leg extensions Banded bridge x 10 Bridging x 10, added GTB double knee fall out x 2 sets x 10  Pigeon stretch x 1 each 15# squat 10 x 2 15# dead lift x 15- needs cues 25% of the time  Palloff press - single blue band x 10 each way Blue band shoulder ext x 15 Plank on forearm/toes- unable to complete with good form/ also increase LBP Plank on forearm/knees- cues for PPT 20 sec     [facility] Adult PT Treatment:                                                DATE: 09/06/22 Therapeutic Exercise: Recumbent bike L2 for 5 min  Cat and camel x 8 to child's pose  Quadruped stability; UE lift, LE lift and then full bird dog  Bridging x 10, added GTB double knee fall out x 2 sets x 10  Bridge with march x 10  LTR x 10  Modalities: IFC to tolerance, 15 min with MHP in prone  Self Care: IFC, TENS unit , stability in quadruped    [facility] Adult PT Treatment:                                                DATE: 09/04/22 Therapeutic Exercise: Recumbent bike L 3 for 5 min  Anterior/post tilt on soft Pilates ball and  for progression  Single leg bent knee fall out alternating  March x 10  SLR x 10  90/90 hold 30 sec  Reverse toe taps  Bridge with ball x 10  Sit to stand 10 lbs  Squat x 15 with 10 lbs  Dead lift x 15  HEP new: 90/90 and toe taps, no ball under pelvis      PATIENT EDUCATION:  Education details: see just above . Given printout of Lumbar stabilization:  TrA, multifidus and pelvic floor  Person educated: Patient Education method: Explanation, Demonstration, and Handouts Education comprehension: verbalized understanding, returned demonstration, and needs further education   HOME EXERCISE PROGRAM: L stab I  TrA with heel slide, march and clam (not in Med Bridge)  Access Code: [email or web address] URL: [email or web address] Date: 09/04/2022 Prepared by: Raeford Razor  Exercises - Hooklying Clamshell with Resistance  - 2 x daily - 7 x weekly - 2 sets - 10 reps - 5 hold - Supine Bridge with Spinal Articulation  - 1 x daily - 7 x weekly - 2 sets - 10 reps - Sit-to -stand hip hinge with dowel  - 1 x daily - 7 x weekly - 2 sets - 10 reps - Supine March with Posterior Pelvic Tilt  - 1 x daily - 7 x weekly - 2 sets - 10 reps - Supine 90/90 Abdominal Bracing  - 1 x daily - 7 x weekly - 1 sets - 3 reps - 30 hold - Supine 90/90 Alternating Heel Touches with Posterior Pelvic Tilt  - 1 x daily - 7 x weekly - 2 sets - 10 reps - 5 hold  ASSESSMENT:   CLINICAL IMPRESSION: Urmila continues to notice improved core strength and stability but will occasionally experienced increased back pain (usually when standing).  She has transient LLE numbness, weakness and pain.  The episodes last only about 10 min but can be concerning for her. FOTO score is improved.  She will cont to benefit from skilled PT to improve core strength and provide correction for functional activity.   OBJECTIVE IMPAIRMENTS: decreased mobility, difficulty walking, decreased ROM, decreased strength, increased fascial restrictions, impaired flexibility, improper body mechanics, postural dysfunction, pain, and hypermobility .    ACTIVITY LIMITATIONS: carrying, lifting, bending, sitting, standing, squatting, sleeping, locomotion level, and caring for others   PARTICIPATION LIMITATIONS: laundry, interpersonal relationship, shopping, community activity, and occupation   PERSONAL FACTORS:  Time since onset of injury/illness/exacerbation and 1-2 comorbidities: Prior abdominal surgery, chronic pain  are also affecting patient's functional outcome.    REHAB POTENTIAL: Excellent   CLINICAL DECISION MAKING: Stable/uncomplicated   EVALUATION COMPLEXITY: Low     GOALS: Goals reviewed with patient? Yes       LONG TERM GOALS: Target date: 09/26/2022     Patient will be independent in home exercise program for core stability Baseline: Goal status: met    2.  Patient will understand and demonstrate proper posture and lifting in order to reduce and prevent back pain  Baseline:  Goal status: met   3.  Patient will report 25% less leg symptoms with ADLs (pain centralization) Baseline:  Goal status: met    4.  Patient will be able to report no increased pain with sitting 30 minutes or more in order to drive Baseline:  Goal status: INITIAL   5.  Patient will be able to stand and walk without limitation of pain for 30 minutes for shopping, community mobility. Baseline:  Goal status: INITIAL   3.  FOTO  score will improve to 76% Baseline: 56%, 70%  Goal status: ongoing        PLAN:   PT FREQUENCY: 1x/week   PT DURATION: 8 weeks   PLANNED INTERVENTIONS: Therapeutic exercises, Therapeutic activity, Neuromuscular re-education, Balance training, Gait training, Patient/Family education, Self Care, Joint mobilization, Electrical stimulation, Spinal mobilization, Cryotherapy, Moist heat, Taping, Manual therapy, and Re-evaluation.   PLAN FOR NEXT SESSION: continue core stab.  Posture, lifting  Raeford Razor, PT 09/18/22 8:51 AM Phone: [phone] Fax: [phone]

## 2022-09-18 ENCOUNTER — Encounter: Payer: Self-pay | Admitting: Physical Therapy

## 2022-09-18 ENCOUNTER — Ambulatory Visit: Payer: 59 | Admitting: Physical Therapy

## 2022-09-18 DIAGNOSIS — M5416 Radiculopathy, lumbar region: Secondary | ICD-10-CM

## 2022-09-18 DIAGNOSIS — M5459 Other low back pain: Secondary | ICD-10-CM | POA: Diagnosis not present

## 2022-09-18 DIAGNOSIS — M2530 Other instability, unspecified joint: Secondary | ICD-10-CM

## 2022-09-20 ENCOUNTER — Encounter: Payer: Self-pay | Admitting: Physical Therapy

## 2022-09-20 ENCOUNTER — Ambulatory Visit: Payer: 59 | Admitting: Physical Therapy

## 2022-09-20 DIAGNOSIS — M5459 Other low back pain: Secondary | ICD-10-CM | POA: Diagnosis not present

## 2022-09-20 DIAGNOSIS — M5416 Radiculopathy, lumbar region: Secondary | ICD-10-CM

## 2022-09-20 DIAGNOSIS — M2530 Other instability, unspecified joint: Secondary | ICD-10-CM

## 2022-09-20 NOTE — Therapy (Addendum)
OUTPATIENT PHYSICAL THERAPY TREATMENT NOTE DISCHARGE   [name] MRN: [id number] DOB:[birth year], 27 y.o., female Today's Date: 09/20/2022  PCP: Burnell Blanks MD    REFERRING PROVIDER: Sherryl Manges NP  END OF SESSION:   PT End of Session - 09/20/22 0934     Visit Number 9    Number of Visits 12    Date for PT Re-Evaluation 09/25/22    Authorization Type [employer] , [facility]    PT Start Time 0932    PT Stop Time 1012    PT Time Calculation (min) 40 min                Past Medical History:  Diagnosis Date   Abdominal pain, acute, right lower quadrant 02/11/2012    Possible Appendix perforation, contained perforation, typhlitis, or Crohn's dz.     Anxiety    Bipolar depression (HCC)    Depression    IBS (irritable bowel syndrome)    Past Surgical History:  Procedure Laterality Date   APPENDECTOMY     COLONOSCOPY     LAPAROSCOPY  06/02/2012   Procedure: LAPAROSCOPY DIAGNOSTIC;  Surgeon: Cherylynn Ridges, MD;  Location: [facility] OR;  Service: General;  Laterality: N/A;   LAPAROTOMY  06/02/2012   Procedure: EXPLORATORY LAPAROTOMY;  Surgeon: Cherylynn Ridges, MD;  Location: [facility] OR;  Service: General;  Laterality: N/A;   LASIK     PARTIAL COLECTOMY  06/02/2012   Procedure: PARTIAL COLECTOMY;  Surgeon: Cherylynn Ridges, MD;  Location: [facility] OR;  Service: General;  Laterality: Right;   Patient Active Problem List   Diagnosis Date Noted   Pre-eclampsia 10/14/2020   Preeclampsia 10/13/2020   Gestational diabetes mellitus (GDM), antepartum 09/21/2020   Normal labor 09/28/2018   SVD (spontaneous vaginal delivery) 09/28/2018   Postop check 06/24/2012   Chronic appendicitis 04/15/2012   Acute renal insufficiency 04/08/2012   Typhlitis 02/26/2012   Abdominal pain, acute, right lower quadrant 02/11/2012   Bipolar depression (HCC)     REFERRING DIAG: lumbar radiculopathy  THERAPY DIAG:  Other low back pain  Radiculopathy, lumbar region  Joint  instability  Rationale for Evaluation and Treatment Rehabilitation  PERTINENT HISTORY:  GI issues, colonecomy,see above   PRECAUTIONS: None   WEIGHT BEARING RESTRICTIONS: No  SUBJECTIVE:                                                                                                                                                                                      SUBJECTIVE STATEMENT: My back was hurting a lot this morning but it has dissipated.     PAIN:  Are you having pain?  Yes: NPRS scale: none  (6/10 this morning ) Pain location: back  Pain description: sharp stabbing pain in back, LE burns  Aggravating factors: is aleady hurting she has increased pain  Relieving factors: leaning, varies standing vs sitting, not lying down, prone better than supine    Pain last night she was in tears, 7/10-8/10. She was cooking and standing. She has been unable to relate the pain to activity.    OBJECTIVE: (objective measures completed at initial evaluation unless otherwise dated)   DIAGNOSTIC FINDINGS:  Pending 08/15/22   PATIENT SURVEYS:  FOTO 56% to 76% goal    SCREENING FOR RED FLAGS: Bowel or bladder incontinence: No Spinal tumors: No Cauda equina syndrome: No Compression fracture: No  Abdominal aneurysm: No   COGNITION: Overall cognitive status: Within functional limits for tasks assessed                          SENSATION: Normal other than radicular sx but none currently    MUSCLE LENGTH: Hamstrings: WNL about 50-55 deg  Thomas test: WNL    POSTURE: rounded shoulders, forward head, and increased thoracic kyphosis, sway back    PALPATION: Hypermobile throughout her thoracic and lumbar spine Pain at T6-T7 and down to sacrum centrally No TTP in gluteals bilaterally or hamstrings in prone    LUMBAR ROM:    AROM eval 09/20/22  Flexion WFL pain in legs  WFL  Extension WFL with pain central pain in spine L4-L5 WFL  Right lateral flexion WFL with pain L  WFL   Left lateral flexion WFL with pain R  WFL  Right rotation WFL no pain    Left rotation WFL no pain     (Blank rows = not tested)   LOWER EXTREMITY ROM:   WFL    Active  Right eval Left eval  Hip flexion      Hip extension      Hip abduction      Hip adduction      Hip internal rotation      Hip external rotation      Knee flexion      Knee extension      Ankle dorsiflexion      Ankle plantarflexion      Ankle inversion      Ankle eversion       (Blank rows = not tested)   LOWER EXTREMITY MMT:     MMT Right eval Left eval  Hip flexion 5/5 5/5 min pain   Hip extension 4/5 pain 4/5 pain   Hip abduction 4/5 4/5  Hip adduction      Hip internal rotation      Hip external rotation      Knee flexion 5/5 5/5  Knee extension 5/5 5/5  Ankle dorsiflexion 5/5 5/5  Ankle plantarflexion      Ankle inversion      Ankle eversion       (Blank rows = not tested)   LUMBAR SPECIAL TESTS:  Straight leg raise test: pain end range , Slump test: Positive, and Single leg stance test: Negative   FUNCTIONAL TESTS:  NT   GAIT: Distance walked: WFL, 150 feet Assistive device utilized:  Level of assistance: NoneComplete Independence Comments: NT    TODAY'S TREATMENT:        [facility] Adult PT Treatment:  DATE: 09/20/22 Therapeutic Exercise: Rec bike L2-3 for 5 min Palloff press single blue band  x 20 each  Seated row 25#  Standing at spring board , extension Bridge with marching  Single leg bridge x 1 each - increased pain  Plank from knees 60 sec - less pain  Prone hip ext alternating with cues for ab brace  Bird dog with elbow to knee x 8 each Side plank 30 sec each  Supine Abs - using exercise ball with upper ab curl up - min increased pain left low back SLR with ab brace x 10 each  Side hip circles x 10 each  Childs pose    [facility] Adult PT Treatment:                                                DATE: 09/18/22 Therapeutic  Exercise: Recumbent bike L2-3 for 5 min  FOTO 70% Ball squeeze x 8 Bridge x 10 with ball  Pilates Tower for LE/Core strength, postural strength, lumbopelvic disassociation and core control.   Exercises included: [place]  Supine [place] single leg arcs and circles  Double leg arcs and circles , squats [place] x 10 then with bridging x 10     [facility] Adult PT Treatment:                                                DATE: 09/11/22 Therapeutic Exercise: Recumbent bike 5 min L1 Foam roller stability exercises  Alt UE flex/ext, horizontal abd double arm and single arm Alt march x 10  Unilateral bent knee fall out  Dead bug Forearm plank knees bent (roller) added knees in and out for lower abs Sideplank 30 sec x 2 and then small pulse for 30 sec each side  Standing core at Springboard : extension and row, circles  Palloff press using slastix and then rotation away   [facility] Adult PT Treatment:                                                DATE: 09/10/22 Therapeutic Exercise: 90/90-progressed to leg extensions Banded bridge x 10 Bridging x 10, added GTB double knee fall out x 2 sets x 10  Pigeon stretch x 1 each 15# squat 10 x 2 15# dead lift x 15- needs cues 25% of the time  Palloff press - single blue band x 10 each way Blue band shoulder ext x 15 Plank on forearm/toes- unable to complete with good form/ also increase LBP Plank on forearm/knees- cues for PPT 20 sec     [facility] Adult PT Treatment:                                                DATE: 09/06/22 Therapeutic Exercise: Recumbent bike L2 for 5 min  Cat and camel x 8 to child's pose  Quadruped stability; UE lift, LE lift and then full bird dog  Bridging x  10, added GTB double knee fall out x 2 sets x 10  Bridge with march x 10  LTR x 10  Modalities: IFC to tolerance, 15 min with MHP in prone  Self Care: IFC, TENS unit , stability in quadruped         PATIENT EDUCATION:  Education details: see just above  . Given printout of Lumbar stabilization: TrA, multifidus and pelvic floor  Person educated: Patient Education method: Explanation, Demonstration, and Handouts Education comprehension: verbalized understanding, returned demonstration, and needs further education   HOME EXERCISE PROGRAM: L stab I  TrA with heel slide, march and clam (not in Med Bridge)  Access Code: [email or web address] URL: [email or web address] Date: 09/04/2022 Prepared by: Karie Mainland  Exercises - Hooklying Clamshell with Resistance  - 2 x daily - 7 x weekly - 2 sets - 10 reps - 5 hold - Supine Bridge with Spinal Articulation  - 1 x daily - 7 x weekly - 2 sets - 10 reps - Sit-to -stand hip hinge with dowel  - 1 x daily - 7 x weekly - 2 sets - 10 reps - Supine March with Posterior Pelvic Tilt  - 1 x daily - 7 x weekly - 2 sets - 10 reps - Supine 90/90 Abdominal Bracing  - 1 x daily - 7 x weekly - 1 sets - 3 reps - 30 hold - Supine 90/90 Alternating Heel Touches with Posterior Pelvic Tilt  - 1 x daily - 7 x weekly - 2 sets - 10 reps - 5 hold  ASSESSMENT:   CLINICAL IMPRESSION: Laure reports increased pain this morning rated at 6/10. She did a lot of housework and laundry yesterday which could contribute. Focused on core strengthening today. Pain is decreasing with planks. She still has pain with bridging. Able to strengthen hip extensors in prone and quadruped without increased pain.    She will cont to benefit from skilled PT to improve core strength and provide correction for functional activity.   OBJECTIVE IMPAIRMENTS: decreased mobility, difficulty walking, decreased ROM, decreased strength, increased fascial restrictions, impaired flexibility, improper body mechanics, postural dysfunction, pain, and hypermobility .    ACTIVITY LIMITATIONS: carrying, lifting, bending, sitting, standing, squatting, sleeping, locomotion level, and caring for others   PARTICIPATION LIMITATIONS: laundry, interpersonal  relationship, shopping, community activity, and occupation   PERSONAL FACTORS: Time since onset of injury/illness/exacerbation and 1-2 comorbidities: Prior abdominal surgery, chronic pain  are also affecting patient's functional outcome.    REHAB POTENTIAL: Excellent   CLINICAL DECISION MAKING: Stable/uncomplicated   EVALUATION COMPLEXITY: Low     GOALS: Goals reviewed with patient? Yes       LONG TERM GOALS: Target date: 09/26/2022     Patient will be independent in home exercise program for core stability Baseline: Goal status: met    2.  Patient will understand and demonstrate proper posture and lifting in order to reduce and prevent back pain  Baseline:  Goal status: met   3.  Patient will report 25% less leg symptoms with ADLs (pain centralization) Baseline:  Goal status: met    4.  Patient will be able to report no increased pain with sitting 30 minutes or more in order to drive Baseline:  Goal status: INITIAL   5.  Patient will be able to stand and walk without limitation of pain for 30 minutes for shopping, community mobility. Baseline:  Goal status: INITIAL   3.  FOTO score will improve to 76% Baseline: 56%, 70%  Goal status:  ongoing        PLAN:   PT FREQUENCY: 1x/week   PT DURATION: 8 weeks   PLANNED INTERVENTIONS: Therapeutic exercises, Therapeutic activity, Neuromuscular re-education, Balance training, Gait training, Patient/Family education, Self Care, Joint mobilization, Electrical stimulation, Spinal mobilization, Cryotherapy, Moist heat, Taping, Manual therapy, and Re-evaluation.   PLAN FOR NEXT SESSION: continue core stab.  Posture, lifting  Jannette Spanner, PTA 09/20/22 10:12 AM Phone: [phone] Fax: [phone]      PHYSICAL THERAPY DISCHARGE SUMMARY  Visits from Start of Care: 9  Current functional level related to goals / functional outcomes: See above   Remaining deficits: Unknown   Education / Equipment: HEP, posture and  lifting    Patient agrees to discharge. Patient goals were partially met. Patient is being discharged due to not returning since the last visit.  Karie Mainland, PT 10/25/22 8:28 AM Phone: [phone] Fax: [phone]

## 2022-10-04 ENCOUNTER — Ambulatory Visit: Payer: 59 | Admitting: Physical Therapy

## 2024-08-20 ENCOUNTER — Institutional Professional Consult (permissible substitution): Admitting: Plastic Surgery
